# Patient Record
Sex: Female | Born: 1975
Health system: Southern US, Community
[De-identification: ages and names within clinical notes are randomized; demographics above are authoritative.]

## PROBLEM LIST (undated history)

## (undated) DIAGNOSIS — R7303 Prediabetes: Secondary | ICD-10-CM

## (undated) DIAGNOSIS — E1169 Type 2 diabetes mellitus with other specified complication: Secondary | ICD-10-CM

## (undated) DIAGNOSIS — Z6841 Body Mass Index (BMI) 40.0 and over, adult: Secondary | ICD-10-CM

## (undated) DIAGNOSIS — L989 Disorder of the skin and subcutaneous tissue, unspecified: Secondary | ICD-10-CM

## (undated) DIAGNOSIS — B977 Papillomavirus as the cause of diseases classified elsewhere: Secondary | ICD-10-CM

## (undated) DIAGNOSIS — E785 Hyperlipidemia, unspecified: Secondary | ICD-10-CM

## (undated) HISTORY — DX: Prediabetes: R73.03

## (undated) HISTORY — DX: Hyperlipidemia, unspecified: E11.69

## (undated) HISTORY — DX: Body Mass Index (BMI) 40.0 and over, adult: Z684

## (undated) HISTORY — PX: NO PAST SURGERIES: SHX2092

## (undated) HISTORY — DX: Morbid (severe) obesity due to excess calories: E66.01

## (undated) HISTORY — DX: Hyperlipidemia, unspecified: E78.5

---

## 2013-01-12 ENCOUNTER — Emergency Department (HOSPITAL_COMMUNITY)
Admission: EM | Admit: 2013-01-12 | Discharge: 2013-01-12 | Disposition: A | Discharge status: Home / Self Care | Payer: Managed Care, Other (non HMO) | Attending: Emergency Medicine | Admitting: Emergency Medicine

## 2013-01-12 ENCOUNTER — Encounter (HOSPITAL_COMMUNITY): Payer: Self-pay | Admitting: Emergency Medicine

## 2013-01-12 ENCOUNTER — Emergency Department (HOSPITAL_COMMUNITY): Payer: Managed Care, Other (non HMO)

## 2013-01-12 DIAGNOSIS — X500XXA Overexertion from strenuous movement or load, initial encounter: Secondary | ICD-10-CM | POA: Insufficient documentation

## 2013-01-12 DIAGNOSIS — Y929 Unspecified place or not applicable: Secondary | ICD-10-CM | POA: Insufficient documentation

## 2013-01-12 DIAGNOSIS — M25562 Pain in left knee: Secondary | ICD-10-CM

## 2013-01-12 DIAGNOSIS — W64XXXA Exposure to other animate mechanical forces, initial encounter: Secondary | ICD-10-CM | POA: Insufficient documentation

## 2013-01-12 DIAGNOSIS — Y939 Activity, unspecified: Secondary | ICD-10-CM | POA: Insufficient documentation

## 2013-01-12 DIAGNOSIS — S8990XA Unspecified injury of unspecified lower leg, initial encounter: Secondary | ICD-10-CM | POA: Insufficient documentation

## 2013-01-12 MED ORDER — NAPROXEN 250 MG PO TABS: 250.0000 mg | ORAL_TABLET | Freq: Two times a day (BID) | ORAL | Status: DC

## 2013-01-12 MED ORDER — HYDROCODONE-ACETAMINOPHEN 5-325 MG PO TABS: ORAL_TABLET | ORAL | Status: DC

## 2013-01-12 NOTE — ED Notes (Signed)
Pt states she was hit behind the left knee by a large dog and felt a pop, throbbing pain, constant. Tried OTC advil without relief

## 2013-01-12 NOTE — ED Notes (Signed)
Patient given discharge instruction, verbalized understand. Patient ambulatory on crutches out of the department.  

## 2013-01-12 NOTE — ED Provider Notes (Signed)
CSN: [id number]     Arrival date & time 01/12/13  0557 History   First MD Initiated Contact with Patient 01/12/13 [phone]     Chief Complaint  Patient presents with  . Knee Injury    HPI Pt was seen at 0625. Per pt, c/o gradual onset and persistence of constant left knee "pain" since yesterday. States the pain began after she was hit in the back of her left knee by a dog and "felt a pop." Describes the pain as "throbbing." Pt has been ambulatory since the incident. Pain worsens with weight bearing, improves with rest. Denies focal motor weakness, no tingling/numbness in extremity, no open wounds, no ecchymosis, no erythema, no fevers.    History reviewed. No pertinent past medical history.  History reviewed. No pertinent past surgical history.  History  Substance Use Topics  . Smoking status: Never Smoker   . Smokeless tobacco: Not on file  . Alcohol Use: Yes    Review of Systems ROS: Statement: All systems negative except as marked or noted in the HPI; Constitutional: Negative for fever and chills. ; ; Eyes: Negative for eye pain, redness and discharge. ; ; ENMT: Negative for ear pain, hoarseness, nasal congestion, sinus pressure and sore throat. ; ; Cardiovascular: Negative for chest pain, palpitations, diaphoresis, dyspnea and peripheral edema. ; ; Respiratory: Negative for cough, wheezing and stridor. ; ; Gastrointestinal: Negative for nausea, vomiting, diarrhea, abdominal pain, blood in stool, hematemesis, jaundice and rectal bleeding. . ; ; Genitourinary: Negative for dysuria, flank pain and hematuria. ; ; Musculoskeletal: Negative for back pain and neck pain. +left knee pain, swelling.; ; Skin: Negative for pruritus, rash, abrasions, blisters, bruising and skin lesion.; ; Neuro: Negative for headache, lightheadedness and neck stiffness. Negative for weakness, altered level of consciousness , altered mental status, extremity weakness, paresthesias, involuntary movement, seizure and syncope.      Allergies  Amoxapine and related and Amoxicillin  Home Medications   Current Outpatient Rx  Name  Route  Sig  Dispense  Refill  . HYDROcodone-acetaminophen (NORCO/VICODIN) 5-325 MG per tablet      1 or 2 tabs PO q6 hours prn pain   8 tablet   0   . naproxen (NAPROSYN) 250 MG tablet   Oral   Take 1 tablet (250 mg total) by mouth 2 (two) times daily with a meal.   14 tablet   0    BP 135/77  Pulse 101  Temp(Src) 98.6 F (37 C) (Oral)  Resp 20  Ht 5\' 5"  (1.651 m)  Wt 205 lb (92.987 kg)  BMI 34.11 kg/m2  SpO2 100%  LMP 01/01/2013 Physical Exam 0630: Physical examination:  Nursing notes reviewed; Vital signs and O2 SAT reviewed;  Constitutional: Well developed, Well nourished, Well hydrated, In no acute distress; Head:  Normocephalic, atraumatic; Eyes: EOMI, PERRL, No scleral icterus; ENMT: Mouth and pharynx normal, Mucous membranes moist; Neck: Supple, Full range of motion, No lymphadenopathy; Cardiovascular: Regular rate and rhythm, No murmur, rub, or gallop; Respiratory: Breath sounds clear & equal bilaterally, No rales, rhonchi, wheezes.  Speaking full sentences with ease, Normal respiratory effort/excursion; Chest: Nontender, Movement normal;; Extremities: Pulses normal, +decreased ROM F/E left knee due to increasing pain. Pt is able able to lift extended LLE off stretcher, and extend left lower leg against resistance.  No ligamentous laxity.  No patellar or quad tendon step-offs.  NMS intact left foot, strong pedal pp. +plantarflexion of left foot w/calf squeeze.  No palpable gap left Achilles's tendon.  No proximal fibular head tenderness.  No erythema, warmth, ecchymosis or deformity. +anterior left knee with generalized tenderness to palp, without specific area of point tenderness. NT left ankle/foot. No calf tenderness, edema or asymmetry.; Neuro: AA&Ox3, Major CN grossly intact.  Speech clear. No gross focal motor or sensory deficits in extremities.; Skin: Color normal,  Warm, Dry.   ED Course  Procedures    MDM  MDM Reviewed: nursing note and vitals Interpretation: x-ray   Dg Knee Complete 4 Views Left 01/12/2013   CLINICAL DATA:  Pain post blunt trauma  EXAM: LEFT KNEE - COMPLETE 4+ VIEW  COMPARISON:  None.  FINDINGS: There is no evidence of fracture, dislocation, or joint effusion. There is no evidence of arthropathy or other focal bone abnormality. Soft tissues are unremarkable.  IMPRESSION: Negative.   Electronically Signed   By: Oley Balm M.D.   On: 01/12/2013 06:28    0650:  No acute findings on XR. Will tx symptomatically with crutches/knee immobilizer, f/u Ortho MD. Dx and testing d/w pt and family.  Questions answered.  Verb understanding, agreeable to d/c home with outpt f/u.    Laray Anger, DO 01/13/13 1248

## 2013-01-16 ENCOUNTER — Encounter: Payer: Self-pay | Admitting: Orthopedic Surgery

## 2013-01-16 ENCOUNTER — Ambulatory Visit (INDEPENDENT_AMBULATORY_CARE_PROVIDER_SITE_OTHER): Payer: Managed Care, Other (non HMO) | Admitting: Orthopedic Surgery

## 2013-01-16 VITALS — BP 139/97 | Ht 62.0 in | Wt 220.0 lb

## 2013-01-16 DIAGNOSIS — S83412A Sprain of medial collateral ligament of left knee, initial encounter: Secondary | ICD-10-CM

## 2013-01-16 DIAGNOSIS — S83419A Sprain of medial collateral ligament of unspecified knee, initial encounter: Secondary | ICD-10-CM

## 2013-01-16 MED ORDER — HYDROCODONE-ACETAMINOPHEN 5-325 MG PO TABS: 1.0000 | ORAL_TABLET | Freq: Four times a day (QID) | ORAL | Status: DC | PRN

## 2013-01-16 MED ORDER — NAPROXEN 250 MG PO TABS: 250.0000 mg | ORAL_TABLET | Freq: Two times a day (BID) | ORAL | Status: DC

## 2013-01-16 NOTE — Patient Instructions (Addendum)
Pick up Naprosyn at pharmacy

## 2013-01-16 NOTE — Progress Notes (Signed)
Patient ID: Jessica Wang, female   DOB: 09-27-1975, 37 y.o.   MRN: [id number]  Chief Complaint  Patient presents with  . Knee Pain    Left knee pain d/t injury 01/17/13    HISTORY: 37 years old female who injured on Saturday, September 20. A dogear from the side her knee buckled she fell pop. Later that evening she did go to the emergency room because of persistent sharp dull throbbing stabbing 7/10 constant pain is better she doesn't walk on a prolonged time. She reports swelling and increased pain with walking. She was treated with naproxen twice a day and hydrocodone.  Review of systems negative except for seasonal allergies and certain food reactions. Allergy to amoxicillin. 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 BP 139/97  Ht 5\' 2"  (1.575 m)  Wt 220 lb (99.791 kg)  BMI 40.23 kg/m2  LMP 01/01/2013  Slightly obese and weight with a brace on her leg  Left knee tender over the medial joint line and medial epicondyle she has pain with valgus stress with a stable MCL her range of motion is limited actively to 90 passively 130 muscle tone is normal skin is intact good pulses noted normal sensation normal balance  X-rays negative  Sprain MCL versus medial meniscal tear  Recommend hinged knee brace continue Naprosyn and Vicodin return for reexamination and MRI if not improved  OK to return to work

## 2013-02-13 ENCOUNTER — Ambulatory Visit (INDEPENDENT_AMBULATORY_CARE_PROVIDER_SITE_OTHER): Payer: Managed Care, Other (non HMO) | Admitting: Orthopedic Surgery

## 2013-02-13 ENCOUNTER — Encounter: Payer: Self-pay | Admitting: Orthopedic Surgery

## 2013-02-13 VITALS — BP 114/77 | Ht 62.0 in | Wt 220.0 lb

## 2013-02-13 DIAGNOSIS — S83412D Sprain of medial collateral ligament of left knee, subsequent encounter: Secondary | ICD-10-CM

## 2013-02-13 DIAGNOSIS — Z5189 Encounter for other specified aftercare: Secondary | ICD-10-CM

## 2013-02-13 NOTE — Progress Notes (Signed)
Patient ID: Jessica Wang, female   DOB: [birth year], 37 y.o.   MRN: [id number]  Chief Complaint  Patient presents with  . Follow-up    4 week recheck left knee MCL sprain. DOI 01-17-13.   Status post MCL sprain left knee doing better mild pain currently in a hinged knee brace  No catching locking or giving way  The knee looks good she has no tenderness over the medial epicondyle no laxity on valgus stress mild discomfort knee stable in anteroposterior plane motor exam normal skin intact good pulse and normal sensation BP 114/77  Ht 5\' 2"  (1.575 m)  Wt 220 lb (99.791 kg)  BMI 40.23 kg/m2  MCL sprain  Brace x2 weeks follow up as needed

## 2013-02-13 NOTE — Patient Instructions (Signed)
Wear brace 2 more weeks

## 2013-03-05 ENCOUNTER — Encounter (HOSPITAL_COMMUNITY): Payer: Self-pay | Admitting: Emergency Medicine

## 2013-03-05 ENCOUNTER — Emergency Department (HOSPITAL_COMMUNITY)
Admission: EM | Admit: 2013-03-05 | Discharge: 2013-03-05 | Disposition: A | Discharge status: Home / Self Care | Payer: Managed Care, Other (non HMO) | Attending: Emergency Medicine | Admitting: Emergency Medicine

## 2013-03-05 DIAGNOSIS — IMO0002 Reserved for concepts with insufficient information to code with codable children: Secondary | ICD-10-CM | POA: Insufficient documentation

## 2013-03-05 DIAGNOSIS — L0201 Cutaneous abscess of face: Secondary | ICD-10-CM | POA: Insufficient documentation

## 2013-03-05 DIAGNOSIS — L03211 Cellulitis of face: Secondary | ICD-10-CM | POA: Insufficient documentation

## 2013-03-05 DIAGNOSIS — Z791 Long term (current) use of non-steroidal anti-inflammatories (NSAID): Secondary | ICD-10-CM | POA: Insufficient documentation

## 2013-03-05 MED ORDER — LIDOCAINE HCL (PF) 2 % IJ SOLN
2.0000 mL | Freq: Once | INTRAMUSCULAR | Status: AC
  Administered 2013-03-05: 2 mL
  Filled 2013-03-05: qty 10

## 2013-03-05 MED ORDER — SULFAMETHOXAZOLE-TRIMETHOPRIM 800-160 MG PO TABS
1.0000 | ORAL_TABLET | Freq: Two times a day (BID) | ORAL | Status: DC
Start: 2013-03-05 — End: 2013-05-19

## 2013-03-05 MED ORDER — HYDROCODONE-ACETAMINOPHEN 5-325 MG PO TABS: 1.0000 | ORAL_TABLET | ORAL | Status: DC | PRN

## 2013-03-05 NOTE — ED Notes (Signed)
Started out as pimple per pt to left lower area under jaw. States was bigger yesterday. Area is red, hard, warm to touch and hardness approx size of golf ball size. No trouble swallowing.

## 2013-03-09 NOTE — ED Provider Notes (Signed)
CSN: [id number]     Arrival date & time 03/05/13  1007 History   First MD Initiated Contact with Patient 03/05/13 1017     Chief Complaint  Patient presents with  . Abscess   (Consider location/radiation/quality/duration/timing/severity/associated sxs/prior Treatment) HPI Comments: Jessica Wang is a 37 y.o. Female presenting with an abscess of her left inferior mandible which started out as a pimple which she squeezed a small amount of pus from 2 days ago. It has since become larger and more intensely red and painful, although states it was larger yesterday but has been applying warm compresses, yet denies spontaneous drainage.  She denies previous abscess history.  There is no radiation of pain, she denies fever and has no difficulty swallowing.     The history is provided by the patient.    History reviewed. No pertinent past medical history. History reviewed. No pertinent past surgical history. Family History  Problem Relation Age of Onset  . Heart disease    . Cancer    . Arthritis     History  Substance Use Topics  . Smoking status: Never Smoker   . Smokeless tobacco: Not on file  . Alcohol Use: Yes     Comment: :"ight"   OB History   Grav Para Term Preterm Abortions TAB SAB Ect Mult Living                 Review of Systems  Constitutional: Negative for fever and chills.  HENT: Negative for facial swelling.   Respiratory: Negative for shortness of breath and wheezing.   Skin: Positive for color change and wound.  Neurological: Negative for numbness.    Allergies  Amoxapine and related and Amoxicillin  Home Medications   Current Outpatient Rx  Name  Route  Sig  Dispense  Refill  . diphenhydrAMINE (BENADRYL) 25 mg capsule   Oral   Take 25 mg by mouth at bedtime as needed for allergies or sleep.         Marland Kitchen triamcinolone (NASACORT ALLERGY 24HR) 55 MCG/ACT AERO nasal inhaler   Nasal   Place 2 sprays into the nose daily.         Marland Kitchen  HYDROcodone-acetaminophen (NORCO/VICODIN) 5-325 MG per tablet   Oral   Take 1 tablet by mouth every 6 (six) hours as needed for pain.   120 tablet   0   . HYDROcodone-acetaminophen (NORCO/VICODIN) 5-325 MG per tablet   Oral   Take 1 tablet by mouth every 4 (four) hours as needed for moderate pain.   15 tablet   0   . naproxen (NAPROSYN) 250 MG tablet   Oral   Take 1 tablet (250 mg total) by mouth 2 (two) times daily with a meal.   120 tablet   0   . sulfamethoxazole-trimethoprim (SEPTRA DS) 800-160 MG per tablet   Oral   Take 1 tablet by mouth 2 (two) times daily.   28 tablet   0    BP 135/91  Pulse 107  Temp(Src) 98.3 F (36.8 C) (Oral)  Resp 18  SpO2 100%  LMP 02/27/2013 Physical Exam  Constitutional: She appears well-developed and well-nourished. No distress.  HENT:  Head: Normocephalic.  Neck: Neck supple.    Abscess left mandible which is indurated with centrally raised and fluctuant center, 4 cm.   Cardiovascular: Normal rate.   Pulmonary/Chest: Effort normal. She has no wheezes.  Musculoskeletal: Normal range of motion. She exhibits no edema.  Lymphadenopathy:  Head (left side): Submandibular adenopathy present.    ED Course  Procedures (including critical care time)  INCISION AND DRAINAGE Performed by: Burgess Amor Consent: Verbal consent obtained. Risks and benefits: risks, benefits and alternatives were discussed Type: abscess  Body area: left mandible  Anesthesia: local infiltration  Incision was made with a scalpel.  Local anesthetic: lidocaine 2% without epinephrine  Anesthetic total: 2 ml  Complexity: complex Blunt dissection to break up loculations  Drainage: purulent  Drainage amount: moderate  Packing material: no packing  Patient tolerance: Patient tolerated the procedure well with no immediate complications.    Labs Review Labs Reviewed - No data to display Imaging Review No results found.  EKG Interpretation     None       MDM   1. Facial abscess    Pt was seen by Dr. Estell Harpin prior to I & D.  She was prescribed bactrim,  Hydrocodone.  Encouraged warm compresses and recheck by Dr. Suszanne Conners in his [place] clinic in 1 day.  Pt to call for appt.    Burgess Amor, PA-C 03/09/13 [phone]

## 2013-03-11 NOTE — ED Provider Notes (Signed)
Medical screening examination/treatment/procedure(s) were performed by non-physician practitioner and as supervising physician I was immediately available for consultation/collaboration.  EKG Interpretation   None         Chimere Klingensmith L Arvind Mexicano, MD 03/11/13 1107 

## 2013-04-24 DIAGNOSIS — L989 Disorder of the skin and subcutaneous tissue, unspecified: Secondary | ICD-10-CM

## 2013-04-24 HISTORY — DX: Disorder of the skin and subcutaneous tissue, unspecified: L98.9

## 2013-05-19 ENCOUNTER — Encounter (HOSPITAL_BASED_OUTPATIENT_CLINIC_OR_DEPARTMENT_OTHER): Payer: Self-pay | Admitting: *Deleted

## 2013-05-23 NOTE — H&P (Signed)
Assessment  Cellulitis (682.9) (L03.90). Discussed  It has not gone down any further but it has stayed the same size. On exam, there is what appears to be a cystic lesion in the dermal layer of the skin. Recommend excision of this under local anesthesia in the outpatient setting. Reason For Visit  Cellulitis. Allergies  Amoxicillin TABS. Active Problems  Cellulitis   (682.9) (L03.90). Family Hx  Family history of malignant neoplasm of breast: Maternal Grandmother (V16.3) (Z80.3) Ovarian ca: Maternal Grandmother (C56.9) Seasonal allergies: Mother,Father (J30.2). Personal Hx  Alcohol use; social 2 drinks/day or fewer Caffeine use (V49.89) (F15.929); 2 cups daily Never smoker (V49.89) (Z78.9). Signature  Electronically signed by : Serena ColonelJefry  Lulla Linville  M.D.; 04/29/2013 2:02 PM EST.

## 2013-05-26 ENCOUNTER — Encounter (HOSPITAL_BASED_OUTPATIENT_CLINIC_OR_DEPARTMENT_OTHER): Payer: Self-pay | Admitting: *Deleted

## 2013-05-26 ENCOUNTER — Ambulatory Visit (HOSPITAL_BASED_OUTPATIENT_CLINIC_OR_DEPARTMENT_OTHER)
Admission: RE | Admit: 2013-05-26 | Discharge: 2013-05-26 | Disposition: A | Discharge status: Home / Self Care | Payer: Managed Care, Other (non HMO) | Source: Ambulatory Visit | Attending: Otolaryngology | Admitting: Otolaryngology

## 2013-05-26 ENCOUNTER — Encounter (HOSPITAL_BASED_OUTPATIENT_CLINIC_OR_DEPARTMENT_OTHER)
Admission: RE | Disposition: A | Discharge status: Home / Self Care | Payer: Self-pay | Source: Ambulatory Visit | Attending: Otolaryngology

## 2013-05-26 DIAGNOSIS — L989 Disorder of the skin and subcutaneous tissue, unspecified: Secondary | ICD-10-CM

## 2013-05-26 HISTORY — DX: Disorder of the skin and subcutaneous tissue, unspecified: L98.9

## 2013-05-26 HISTORY — PX: LESION REMOVAL: SHX5196

## 2013-05-26 SURGERY — MINOR EXCISION OF LESION
Anesthesia: LOCAL | Site: Neck | Laterality: Left

## 2013-05-26 MED ORDER — LACTATED RINGERS IV SOLN: INTRAVENOUS | Status: DC

## 2013-05-26 MED ORDER — OXYMETAZOLINE HCL 0.05 % NA SOLN
NASAL | Status: AC
  Filled 2013-05-26: qty 15

## 2013-05-26 MED ORDER — MIDAZOLAM HCL 2 MG/2ML IJ SOLN: 1.0000 mg | INTRAMUSCULAR | Status: DC | PRN

## 2013-05-26 MED ORDER — MIDAZOLAM HCL 2 MG/ML PO SYRP: 12.0000 mg | ORAL_SOLUTION | Freq: Once | ORAL | Status: DC | PRN

## 2013-05-26 MED ORDER — LIDOCAINE-EPINEPHRINE 1 %-1:100000 IJ SOLN
INTRAMUSCULAR | Status: AC
  Filled 2013-05-26: qty 1

## 2013-05-26 MED ORDER — LIDOCAINE-EPINEPHRINE 1 %-1:100000 IJ SOLN
INTRAMUSCULAR | Status: DC | PRN
  Administered 2013-05-26: 2 mL

## 2013-05-26 MED ORDER — FENTANYL CITRATE 0.05 MG/ML IJ SOLN: 50.0000 ug | INTRAMUSCULAR | Status: DC | PRN

## 2013-05-26 SURGICAL SUPPLY — 51 items
ATTRACTOMAT 16X20 MAGNETIC DRP (DRAPES) IMPLANT
BENZOIN TINCTURE PRP APPL 2/3 (GAUZE/BANDAGES/DRESSINGS) IMPLANT
BLADE SURG 15 STRL LF DISP TIS (BLADE) ×2 IMPLANT
BLADE SURG 15 STRL SS (BLADE) ×1
CANISTER SUCT 1200ML W/VALVE (MISCELLANEOUS) IMPLANT
CLEANER CAUTERY TIP 5X5 PAD (MISCELLANEOUS) ×2 IMPLANT
CLIP TI MEDIUM 6 (CLIP) IMPLANT
CLIP TI WIDE RED SMALL 6 (CLIP) IMPLANT
CORDS BIPOLAR (ELECTRODE) IMPLANT
COVER MAYO STAND STRL (DRAPES) ×3 IMPLANT
COVER TABLE BACK 60X90 (DRAPES) ×3 IMPLANT
DERMABOND ADVANCED (GAUZE/BANDAGES/DRESSINGS)
DERMABOND ADVANCED .7 DNX12 (GAUZE/BANDAGES/DRESSINGS) IMPLANT
DRAIN JACKSON RD 7FR 3/32 (WOUND CARE) IMPLANT
DRAIN PENROSE 1/4X12 LTX STRL (WOUND CARE) IMPLANT
DRAPE U-SHAPE 76X120 STRL (DRAPES) ×3 IMPLANT
ELECT COATED BLADE 2.86 ST (ELECTRODE) ×3 IMPLANT
ELECT REM PT RETURN 9FT ADLT (ELECTROSURGICAL) ×3
ELECTRODE REM PT RTRN 9FT ADLT (ELECTROSURGICAL) ×2 IMPLANT
EVACUATOR SILICONE 100CC (DRAIN) IMPLANT
GAUZE SPONGE 4X4 16PLY XRAY LF (GAUZE/BANDAGES/DRESSINGS) IMPLANT
GLOVE BIO SURGEON STRL SZ 6.5 (GLOVE) ×3 IMPLANT
GLOVE ECLIPSE 7.5 STRL STRAW (GLOVE) ×3 IMPLANT
GOWN STRL REUS W/ TWL LRG LVL3 (GOWN DISPOSABLE) ×4 IMPLANT
GOWN STRL REUS W/TWL LRG LVL3 (GOWN DISPOSABLE) ×2
NEEDLE 27GAX1X1/2 (NEEDLE) ×3 IMPLANT
NEEDLE HYPO 25X1 1.5 SAFETY (NEEDLE) ×3 IMPLANT
NS IRRIG 1000ML POUR BTL (IV SOLUTION) IMPLANT
PACK BASIN DAY SURGERY FS (CUSTOM PROCEDURE TRAY) ×3 IMPLANT
PAD CLEANER CAUTERY TIP 5X5 (MISCELLANEOUS) ×1
PENCIL FOOT CONTROL (ELECTRODE) ×3 IMPLANT
RUBBERBAND STERILE (MISCELLANEOUS) IMPLANT
SHEET MEDIUM DRAPE 40X70 STRL (DRAPES) IMPLANT
SPONGE GAUZE 2X2 8PLY STRL LF (GAUZE/BANDAGES/DRESSINGS) IMPLANT
SPONGE GAUZE 4X4 12PLY STER LF (GAUZE/BANDAGES/DRESSINGS) IMPLANT
STAPLER VISISTAT 35W (STAPLE) IMPLANT
STRIP CLOSURE SKIN 1/2X4 (GAUZE/BANDAGES/DRESSINGS) IMPLANT
SUCTION FRAZIER TIP 10 FR DISP (SUCTIONS) IMPLANT
SUT CHROMIC 3 0 PS 2 (SUTURE) IMPLANT
SUT CHROMIC 4 0 P 3 18 (SUTURE) IMPLANT
SUT ETHILON 4 0 PS 2 18 (SUTURE) ×3 IMPLANT
SUT ETHILON 5 0 P 3 18 (SUTURE)
SUT NYLON ETHILON 5-0 P-3 1X18 (SUTURE) IMPLANT
SUT PLAIN 5 0 P 3 18 (SUTURE) IMPLANT
SUT SILK 4 0 TIES 17X18 (SUTURE) IMPLANT
SUT SURG 6 0 PRE1/P 10 (SUTURE) IMPLANT
SUT VICRYL 4-0 PS2 18IN ABS (SUTURE) IMPLANT
SYR BULB 3OZ (MISCELLANEOUS) IMPLANT
SYR CONTROL 10ML LL (SYRINGE) ×3 IMPLANT
TRAY DSU PREP LF (CUSTOM PROCEDURE TRAY) ×3 IMPLANT
TUBE CONNECTING 20X1/4 (TUBING) ×3 IMPLANT

## 2013-05-26 NOTE — Interval H&P Note (Signed)
History and Physical Interval Note:  05/26/2013 7:23 AM  Kendell BaneGermaine Thad RangerLongenbach  has presented today for surgery, with the diagnosis of left neck skin lesion  The various methods of treatment have been discussed with the patient and family. After consideration of risks, benefits and other options for treatment, the patient has consented to  Procedure(s): EXCISION LEFT NECK SKIN LESION (Left) as a surgical intervention .  The patient's history has been reviewed, patient examined, no change in status, stable for surgery.  I have reviewed the patient's chart and labs.  Questions were answered to the patient's satisfaction.     Kamarius Buckbee

## 2013-05-26 NOTE — Op Note (Signed)
OPERATIVE REPORT  DATE OF SURGERY: 05/26/2013  PATIENT:  Jessica Wang,  38 y.o. female  PRE-OPERATIVE DIAGNOSIS:  left neck skin lesion  POST-OPERATIVE DIAGNOSIS:  left neck skin lesion  PROCEDURE:  Procedure(s): EXCISION LEFT NECK SKIN LESION  SURGEON:  Susy FrizzleJefry H Jamaia Brum, MD  ASSISTANTS: None  ANESTHESIA:   Local   EBL:  5 ml  DRAINS: None   LOCAL MEDICATIONS USED:  1% Xylocaine with epinephrine  SPECIMEN:  Left anterior neck skin lesion  COUNTS:  Correct  PROCEDURE DETAILS: The patient was taken to the operating room and placed on the operating table in the supine position. The neck was prepped and draped in a standard fashion. 1% Xylocaine with epinephrine was infiltrated around the lesion. An ellipse of skin was outlined with a marking pen. A #15 scalpel was used to incise the skin and to remove the entire lesion. Electrocautery was used on a low setting for hemostasis. The defect was reapproximated with a running 4-0 nylon suture. Bacitracin and a dressing was applied. She tolerated this well.    PATIENT DISPOSITION:  To PACU, stable

## 2013-05-26 NOTE — Discharge Instructions (Signed)
Apply bacitracin ointment 3 times daily with a clean dressing. Keep it clean and dry.

## 2013-05-28 ENCOUNTER — Encounter (HOSPITAL_BASED_OUTPATIENT_CLINIC_OR_DEPARTMENT_OTHER): Payer: Self-pay | Admitting: Otolaryngology

## 2014-06-27 IMAGING — CR DG KNEE COMPLETE 4+V*L*
4 series · 4 of 4 positions shown · non-contrast
Comparison: None.

CLINICAL DATA: Pain post blunt trauma

EXAM:
LEFT KNEE - COMPLETE 4+ VIEW

[view not recorded (1 of 4)]
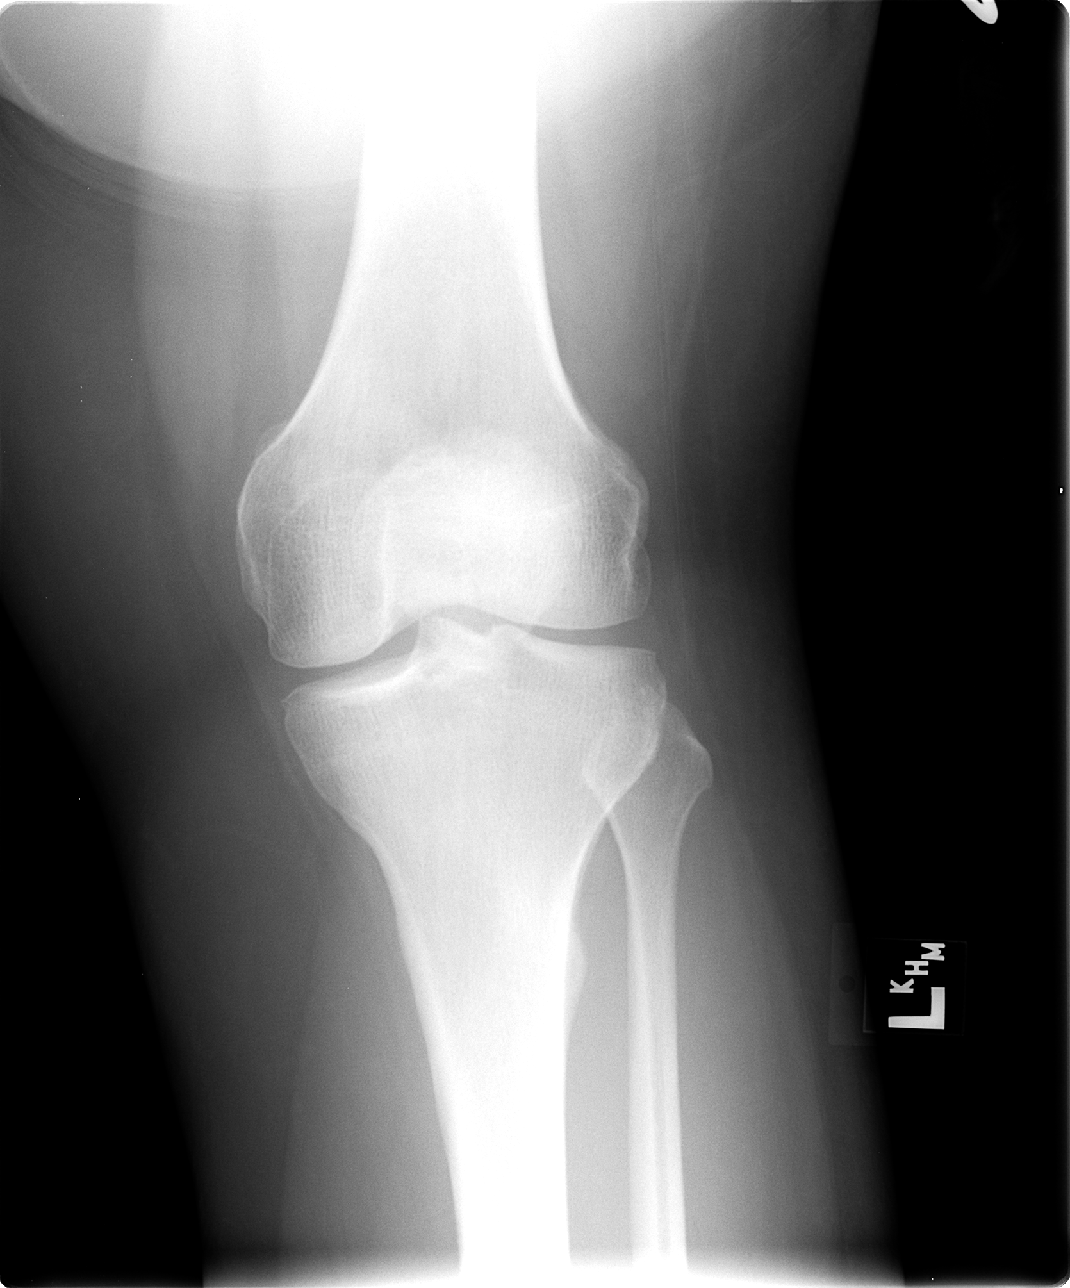

[view not recorded (2 of 4)]
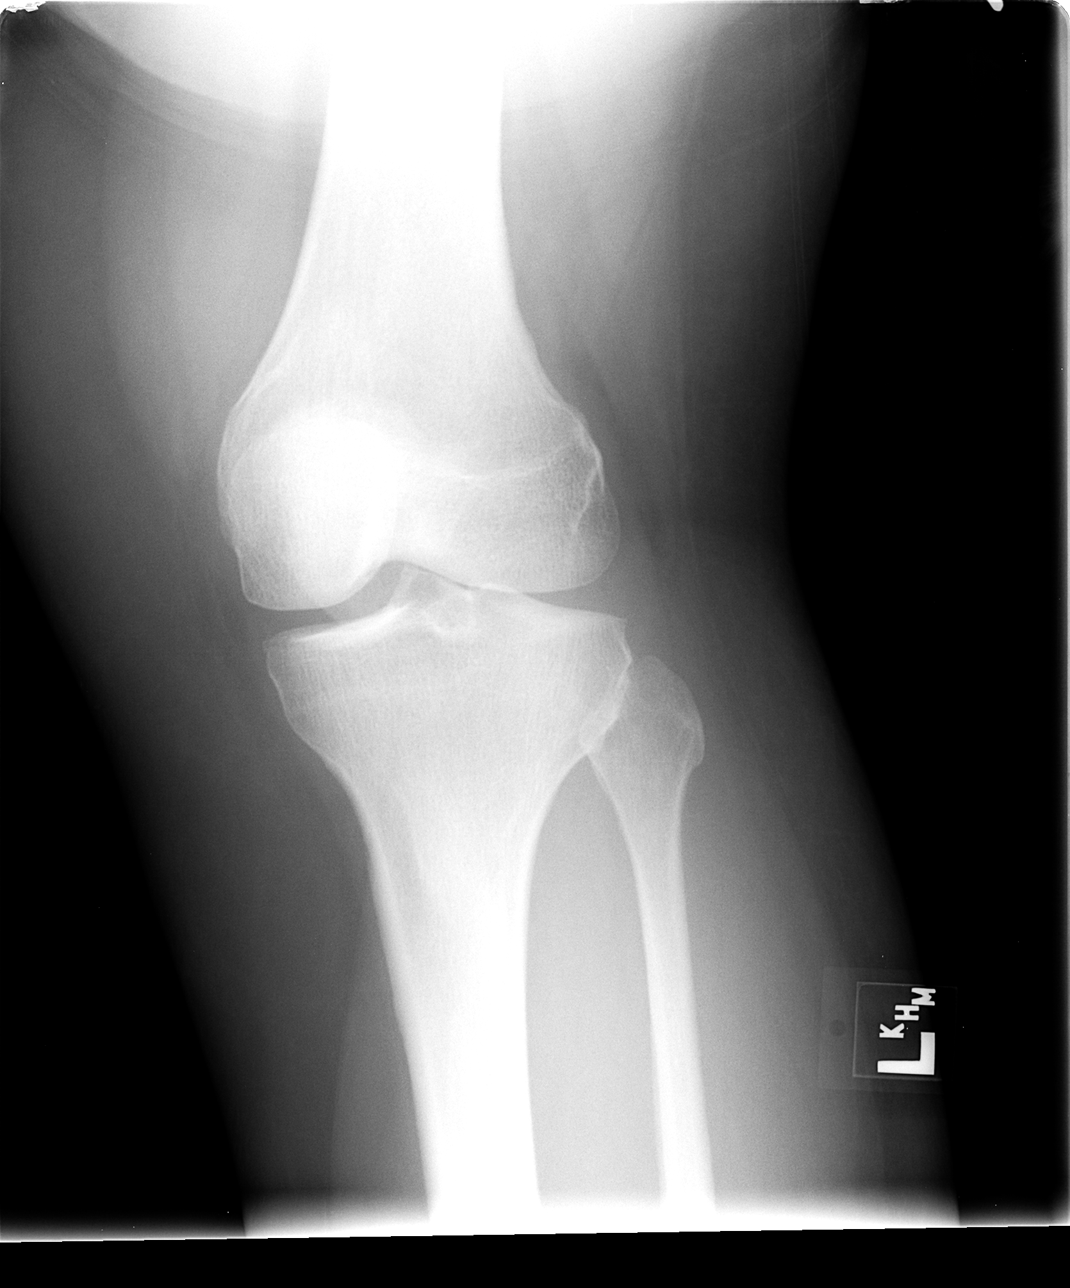

[view not recorded (3 of 4)]
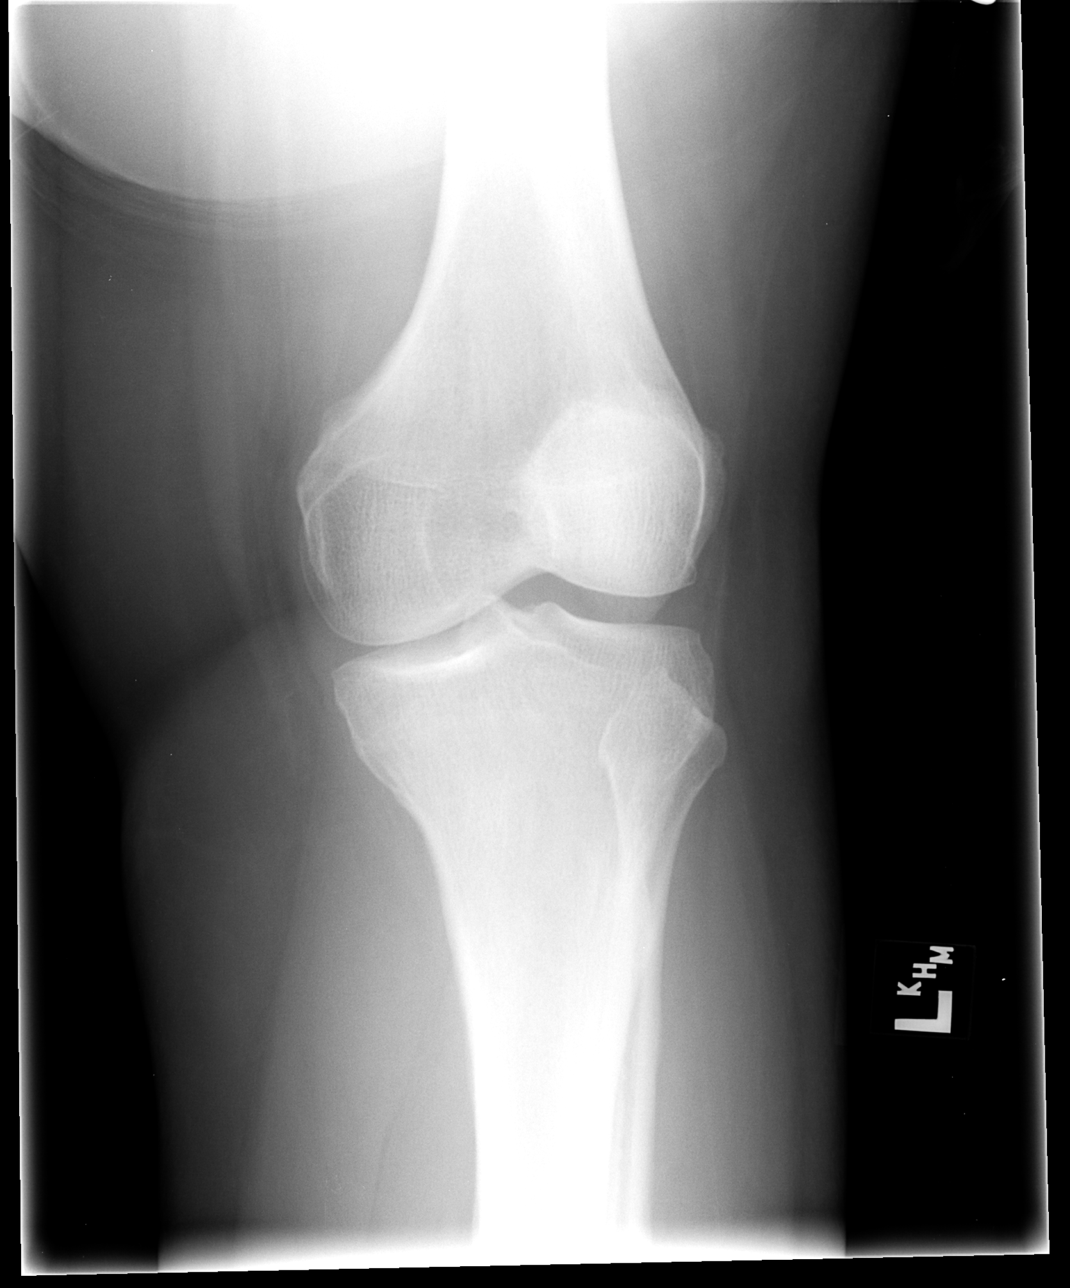

[view not recorded (4 of 4)]
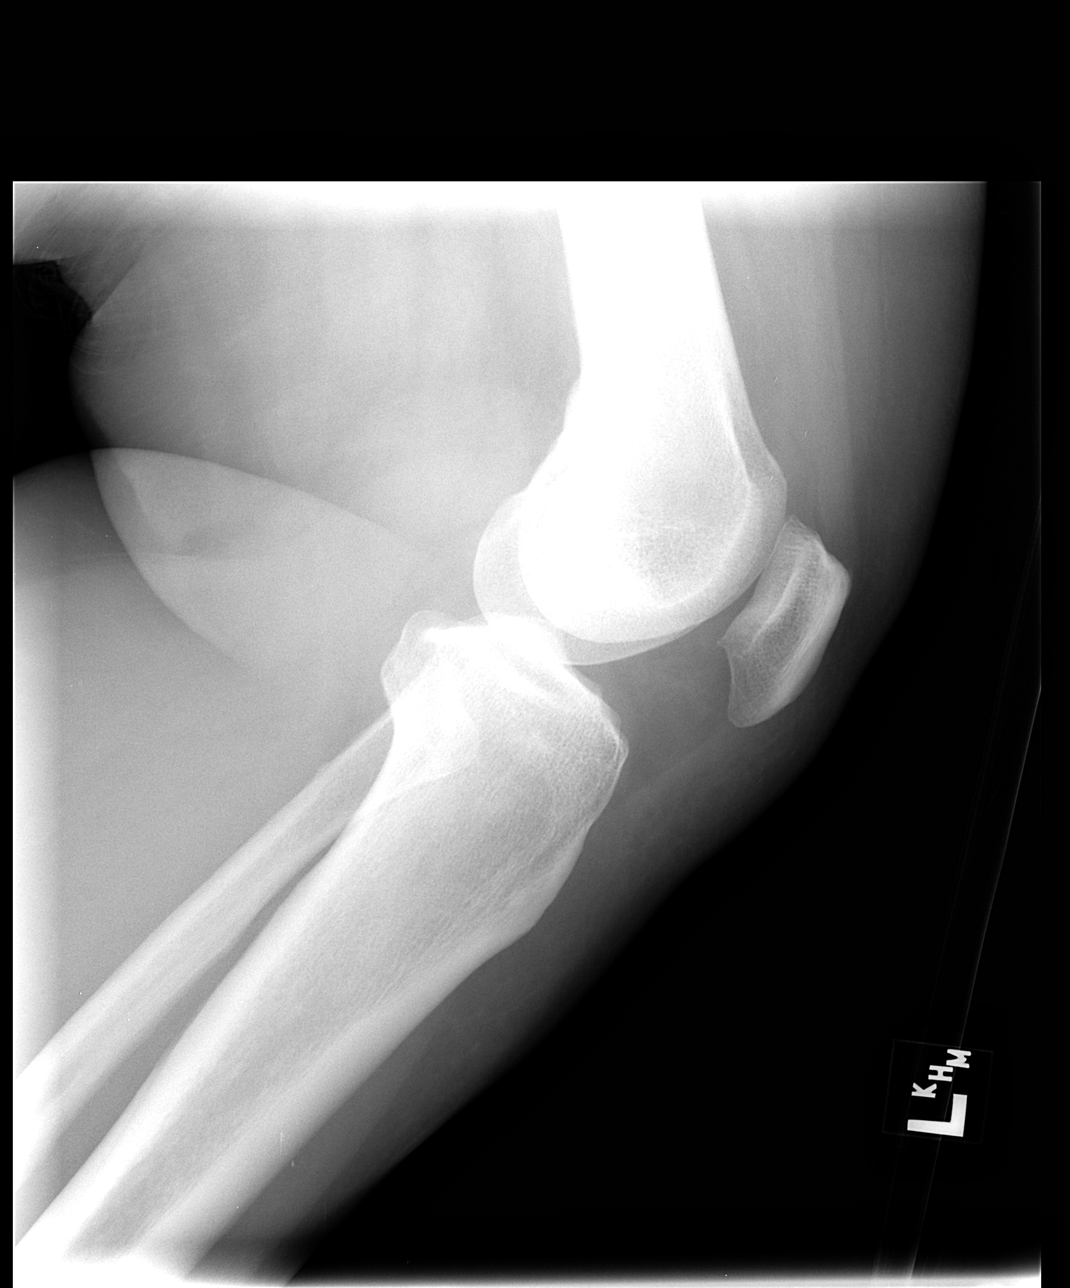

[4 of 4 positions shown; findings below may reference images not displayed]

FINDINGS: There is no evidence of fracture, dislocation, or joint effusion.
There is no evidence of arthropathy or other focal bone abnormality.
Soft tissues are unremarkable.
IMPRESSION: Negative.

## 2017-02-09 ENCOUNTER — Ambulatory Visit (HOSPITAL_COMMUNITY)
Admission: EM | Admit: 2017-02-09 | Discharge: 2017-02-09 | Disposition: A | Discharge status: Home / Self Care | Payer: 59 | Attending: Emergency Medicine | Admitting: Emergency Medicine

## 2017-02-09 ENCOUNTER — Encounter (HOSPITAL_COMMUNITY): Payer: Self-pay | Admitting: Emergency Medicine

## 2017-02-09 DIAGNOSIS — E119 Type 2 diabetes mellitus without complications: Secondary | ICD-10-CM | POA: Insufficient documentation

## 2017-02-09 DIAGNOSIS — R300 Dysuria: Secondary | ICD-10-CM

## 2017-02-09 DIAGNOSIS — R600 Localized edema: Secondary | ICD-10-CM | POA: Diagnosis present

## 2017-02-09 DIAGNOSIS — R109 Unspecified abdominal pain: Secondary | ICD-10-CM | POA: Diagnosis not present

## 2017-02-09 DIAGNOSIS — N898 Other specified noninflammatory disorders of vagina: Secondary | ICD-10-CM | POA: Insufficient documentation

## 2017-02-09 DIAGNOSIS — B373 Candidiasis of vulva and vagina: Secondary | ICD-10-CM | POA: Insufficient documentation

## 2017-02-09 DIAGNOSIS — N3090 Cystitis, unspecified without hematuria: Secondary | ICD-10-CM

## 2017-02-09 DIAGNOSIS — B3731 Acute candidiasis of vulva and vagina: Secondary | ICD-10-CM

## 2017-02-09 LAB — POCT I-STAT, CHEM 8
BUN: 7 mg/dL (ref 6–20)
CREATININE: 0.6 mg/dL (ref 0.44–1.00)
Calcium, Ion: 1.07 mmol/L — ABNORMAL LOW (ref 1.15–1.40)
Chloride: 102 mmol/L (ref 101–111)
GLUCOSE: 243 mg/dL — AB (ref 65–99)
HCT: 40 % (ref 36.0–46.0)
HEMOGLOBIN: 13.6 g/dL (ref 12.0–15.0)
Potassium: 4 mmol/L (ref 3.5–5.1)
Sodium: 138 mmol/L (ref 135–145)
TCO2: 27 mmol/L (ref 22–32)

## 2017-02-09 LAB — POCT URINALYSIS DIP (DEVICE)
Bilirubin Urine: NEGATIVE
Glucose, UA: >=1000 mg/dL — AB
HGB URINE DIPSTICK: NEGATIVE
Leukocytes, UA: NEGATIVE
Nitrite: NEGATIVE
PROTEIN: NEGATIVE mg/dL
SPECIFIC GRAVITY, URINE: 1.025 (ref 1.005–1.030)
UROBILINOGEN UA: 0.2 mg/dL (ref 0.0–1.0)
pH: 6 (ref 5.0–8.0)

## 2017-02-09 MED ORDER — METFORMIN HCL ER 500 MG PO TB24
500.0000 mg | ORAL_TABLET | Freq: Every day | ORAL | 0 refills | Status: DC
Start: 2017-02-09 — End: 2017-08-06

## 2017-02-09 MED ORDER — PHENAZOPYRIDINE HCL 200 MG PO TABS: 200.0000 mg | ORAL_TABLET | Freq: Three times a day (TID) | ORAL | 0 refills | Status: DC

## 2017-02-09 MED ORDER — FLUCONAZOLE 200 MG PO TABS: 200.0000 mg | ORAL_TABLET | Freq: Every day | ORAL | 0 refills | Status: AC

## 2017-02-09 NOTE — ED Triage Notes (Signed)
Pt c/o burning with urination for a few days. Also states her vaginal lips are swollen.

## 2017-02-09 NOTE — Discharge Instructions (Signed)
Diabetes Mellitus and Standards of Medical Care Managing diabetes (diabetes mellitus) can be complicated. Your diabetes treatment may be managed by a team of health care providers, including:  A diet and nutrition specialist (registered dietitian).  A nurse.  A certified diabetes educator (CDE).  A diabetes specialist (endocrinologist).  An eye doctor.  A primary care provider.  A dentist.  Your health care providers follow a schedule in order to help you get the best quality of care. The following schedule is a general guideline for your diabetes management plan. Your health care providers may also give you more specific instructions. HbA1c ( hemoglobin A1c) test This test provides information about blood sugar (glucose) control over the previous 2-3 months. It is used to check whether your diabetes management plan needs to be adjusted.  If you are meeting your treatment goals, this test is done at least 2 times a year.  If you are not meeting treatment goals or if your treatment goals have changed, this test is done 4 times a year.  Blood pressure test  This test is done at every routine medical visit. For most people, the goal is less than 130/80. Ask your health care provider what your goal blood pressure should be. Dental and eye exams  Visit your dentist two times a year.  If you have type 1 diabetes, get an eye exam 3-5 years after you are diagnosed, and then once a year after your first exam. ? If you were diagnosed with type 1 diabetes as a child, get an eye exam when you are age 12 or older and have had diabetes for 3-5 years. After the first exam, you should get an eye exam once a year.  If you have type 2 diabetes, have an eye exam as soon as you are diagnosed, and then once a year after your first exam. Foot care exam  Visual foot exams are done at every routine medical visit. The exams check for cuts, bruises, redness, blisters, sores, or other problems with  the feet.  A complete foot exam is done by your health care provider once a year. This exam includes an inspection of the structure and skin of your feet, and a check of the pulses and sensation in your feet. ? Type 1 diabetes: Get your first exam 3-5 years after diagnosis. ? Type 2 diabetes: Get your first exam as soon as you are diagnosed.  Check your feet every day for cuts, bruises, redness, blisters, or sores. If you have any of these or other problems that are not healing, contact your health care provider. Kidney function test ( urine microalbumin)  This test is done once a year. ? Type 1 diabetes: Get your first test 5 years after diagnosis. ? Type 2 diabetes: Get your first test as soon as you are diagnosed.  If you have chronic kidney disease (CKD), get a serum creatinine and estimated glomerular filtration rate (eGFR) test once a year. Lipid profile (cholesterol, HDL, LDL, triglycerides)  This test should be done when you are diagnosed with diabetes, and every 5 years after the first test. If you are on medicines to lower your cholesterol, you may need to get this test done every year. ? The goal for LDL is less than 100 mg/dL (5.5 mmol/L). If you are at high risk, the goal is less than 70 mg/dL (3.9 mmol/L). ? The goal for HDL is 40 mg/dL (2.2 mmol/L) for men and 50 mg/dL(2.8 mmol/L) for women. An  HDL cholesterol of 60 mg/dL (3.3 mmol/L) or higher gives some protection against heart disease. ? The goal for triglycerides is less than 150 mg/dL (8.3 mmol/L). Immunizations  The yearly flu (influenza) vaccine is recommended for everyone 6 months or older who has diabetes.  The pneumonia (pneumococcal) vaccine is recommended for everyone 2 years or older who has diabetes. If you are 43 or older, you may get the pneumonia vaccine as a series of two separate shots.  The hepatitis B vaccine is recommended for adults shortly after they have been diagnosed with diabetes.  The Tdap  (tetanus, diphtheria, and pertussis) vaccine should be given: ? According to normal childhood vaccination schedules, for children. ? Every 10 years, for adults who have diabetes.  The shingles vaccine is recommended for people who have had chicken pox and are 50 years or older. Mental and emotional health  Screening for symptoms of eating disorders, anxiety, and depression is recommended at the time of diagnosis and afterward as needed. If your screening shows that you have symptoms (you have a positive screening result), you may need further evaluation and be referred to a mental health care provider. Diabetes self-management education  Education about how to manage your diabetes is recommended at diagnosis and ongoing as needed. Treatment plan  Your treatment plan will be reviewed at every medical visit. Summary  Managing diabetes (diabetes mellitus) can be complicated. Your diabetes treatment may be managed by a team of health care providers.  Your health care providers follow a schedule in order to help you get the best quality of care.  Standards of care including having regular physical exams, blood tests, blood pressure monitoring, immunizations, screening tests, and education about how to manage your diabetes.  Your health care providers may also give you more specific instructions based on your individual health. This information is not intended to replace advice given to you by your health care provider. Make sure you discuss any questions you have with your health care provider. Document Released: 02/05/2009 Document Revised: 01/07/2016 Document Reviewed: 01/07/2016 Elsevier Interactive Patient Education  [employer]. With you lab testing you sugar levels are elevated and we will treat you with new onset of diabetes We will start you on metformin for this. You will need to take this every morning then eat after taking. This medication can cause stomach irritation and  increase bowel movements I have listed a primary care doctor that you can call to set up an appoint for follow care.  We will send off your urine and if this returns with an infection we will call you with an antibiotic.

## 2017-02-09 NOTE — ED Provider Notes (Signed)
[facility]    CSN: [id number] Arrival date & time: 02/09/17  1525     History   Chief Complaint Chief Complaint  Patient presents with  . Dysuria  . Groin Swelling    HPI Jessica Wang is a 40 y.o. female.   Pt is here for dysuria , external vaginal irritation, and frequency for 3 days. RT flank pain intermit . States that she has taken otc urinary things, drinking cranberry juice, and drinking water with no relief.       Past Medical History:  Diagnosis Date  . Skin lesion 04/2013   left neck; is open and draining    Patient Active Problem List   Diagnosis Date Noted  . Knee MCL sprain 01/16/2013    Past Surgical History:  Procedure Laterality Date  . LESION REMOVAL Left 05/26/2013   Procedure: MINOR EXICISION OF Neck Skin LESION;  Surgeon: Serena Colonel, MD;  Location:  SURGERY CENTER;  Service: ENT;  Laterality: Left;  . NO PAST SURGERIES      OB History    No data available       Home Medications    Prior to Admission medications   Medication Sig Start Date End Date Taking? Authorizing Provider  fluconazole (DIFLUCAN) 200 MG tablet Take 1 tablet (200 mg total) by mouth daily. 02/09/17 02/16/17  Coralyn Mark, NP  metFORMIN (GLUCOPHAGE XR) 500 MG 24 hr tablet Take 1 tablet (500 mg total) by mouth daily with breakfast. 02/09/17   Coralyn Mark, NP    Family History Family History  Problem Relation Age of Onset  . Heart disease Unknown   . Cancer Unknown   . Arthritis Unknown     Social History Social History  Substance Use Topics  . Smoking status: Never Smoker  . Smokeless tobacco: Never Used  . Alcohol use Yes     Comment: occasionally     Allergies   Amoxicillin   Review of Systems Review of Systems  Constitutional: Negative.   Respiratory: Negative.   Cardiovascular: Negative.   Gastrointestinal: Positive for nausea.       RT side  Flank pain intermit   Endocrine: Positive for polydipsia  and polyuria.  Genitourinary:       Vaginal labia minora erythema, edema, pain on urination,   Musculoskeletal: Negative.   Skin: Negative.   Neurological: Negative.      Physical Exam Triage Vital Signs ED Triage Vitals [02/09/17 1556]  Enc Vitals Group     BP (!) 141/91     Pulse Rate (!) 115     Resp 18     Temp 98 F (36.7 C)     Temp src      SpO2 100 %     Weight      Height      Head Circumference      Peak Flow      Pain Score 8     Pain Loc      Pain Edu?      Excl. in GC?    No data found.   Updated Vital Signs BP (!) 141/91   Pulse (!) 115   Temp 98 F (36.7 C)   Resp 18   LMP 01/26/2017   SpO2 100%   Visual Acuity Right Eye Distance:   Left Eye Distance:   Bilateral Distance:    Right Eye Near:   Left Eye Near:    Bilateral Near:  Physical Exam  Constitutional: She appears well-developed.  Cardiovascular: Normal rate and regular rhythm.   Pulmonary/Chest: Effort normal and breath sounds normal.  Abdominal: Soft. Bowel sounds are normal. There is tenderness.  Genitourinary:  Genitourinary Comments: Labia minora erythema, edema,   Neurological: She is alert.  Skin: Rash noted. There is erythema.  Vaginal area      UC Treatments / Results  Labs (all labs ordered are listed, but only abnormal results are displayed) Labs Reviewed  POCT URINALYSIS DIP (DEVICE) - Abnormal; Notable for the following:       Result Value   Glucose, UA >=1000 (*)    Ketones, ur TRACE (*)    All other components within normal limits  POCT I-STAT, CHEM 8 - Abnormal; Notable for the following:    Glucose, Bld 243 (*)    Calcium, Ion 1.07 (*)    All other components within normal limits  URINE CULTURE    EKG  EKG Interpretation None       Radiology No results found.  Procedures Procedures (including critical care time)  Medications Ordered in UC Medications - No data to display   Initial Impression / Assessment and Plan / UC Course  I  have reviewed the triage vital signs and the nursing notes.  Pertinent labs & imaging results that were available during my care of the patient were reviewed by me and considered in my medical decision making (see chart for details).     With you lab testing you sugar levels are elevated and we will treat you with new onset of diabetes We will start you on metformin for this. You will need to take this every morning then eat after taking. This medication can cause stomach irritation and increase bowel movements I have listed a primary care doctor that you can call to set up an appoint for follow care.  We will send off your urine and if this returns with an infection we will call you with an antibiotic.  Final Clinical Impressions(s) / UC Diagnoses   Final diagnoses:  Cystitis  Dysuria  Yeast infection of the vagina  New onset type 2 diabetes mellitus (HCC)    New Prescriptions Discharge Medication List as of 02/09/2017  5:34 PM    START taking these medications   Details  fluconazole (DIFLUCAN) 200 MG tablet Take 1 tablet (200 mg total) by mouth daily., Starting Fri 02/09/2017, Until Fri 02/16/2017, Normal    metFORMIN (GLUCOPHAGE XR) 500 MG 24 hr tablet Take 1 tablet (500 mg total) by mouth daily with breakfast., Starting Fri 02/09/2017, Normal         Controlled Substance Prescriptions [place] Controlled Substance Registry consulted? Not Applicable   Coralyn MarkMitchell, Kamran Coker L, NP 02/09/17 1750

## 2017-02-11 LAB — URINE CULTURE

## 2017-06-12 DIAGNOSIS — E119 Type 2 diabetes mellitus without complications: Secondary | ICD-10-CM | POA: Diagnosis not present

## 2017-06-12 DIAGNOSIS — R030 Elevated blood-pressure reading, without diagnosis of hypertension: Secondary | ICD-10-CM | POA: Diagnosis not present

## 2017-06-12 DIAGNOSIS — E782 Mixed hyperlipidemia: Secondary | ICD-10-CM | POA: Diagnosis not present

## 2017-06-12 DIAGNOSIS — R000 Tachycardia, unspecified: Secondary | ICD-10-CM | POA: Diagnosis not present

## 2017-06-12 DIAGNOSIS — G4762 Sleep related leg cramps: Secondary | ICD-10-CM | POA: Diagnosis not present

## 2017-08-06 ENCOUNTER — Encounter: Payer: Self-pay | Admitting: Cardiology

## 2017-08-06 ENCOUNTER — Ambulatory Visit: Payer: BLUE CROSS/BLUE SHIELD | Admitting: Cardiology

## 2017-08-06 VITALS — BP 150/92 | HR 105 | Ht 62.0 in | Wt 269.2 lb

## 2017-08-06 DIAGNOSIS — R000 Tachycardia, unspecified: Secondary | ICD-10-CM | POA: Insufficient documentation

## 2017-08-06 DIAGNOSIS — E8881 Metabolic syndrome: Secondary | ICD-10-CM | POA: Diagnosis not present

## 2017-08-06 DIAGNOSIS — R011 Cardiac murmur, unspecified: Secondary | ICD-10-CM

## 2017-08-06 DIAGNOSIS — R030 Elevated blood-pressure reading, without diagnosis of hypertension: Secondary | ICD-10-CM | POA: Diagnosis not present

## 2017-08-06 DIAGNOSIS — I10 Essential (primary) hypertension: Secondary | ICD-10-CM | POA: Insufficient documentation

## 2017-08-06 NOTE — Patient Instructions (Signed)
MEDICATION INSTRUCTION  NO CHANGE     LABS TODAY CMP TSH   RECOMMEND KEEP UP WITH YOUR HEART RATE ON YOUR FITBIT AND KEEP A RECORDING.   TEST WILL SCHEDULE AT [address] Your physician has requested that you have an echocardiogram. Echocardiography is a painless test that uses sound waves to create images of your heart. It provides your doctor with information about the size and shape of your heart and how well your heart's chambers and valves are working. This procedure takes approximately one hour. There are no restrictions for this procedure.    Your physician recommends that you schedule a follow-up appointment in 1 MONTH WITH DR HARDING.

## 2017-08-06 NOTE — Progress Notes (Signed)
PCP: Tanna Furry, MD  Clinic Note: Chief Complaint  Patient presents with  . New Patient (Initial Visit)    "Abnormal EKG ", murmur    HPI:  Jessica Wang is a 42 y.o. female who is being seen today for the evaluation of "irregular EKG "and murmur heard on exam (sinus tachycardia and possible WPW) at the request of Zhou-Talbert, Ralene Bathe,*.  Notably, her father had a history of bicuspid aortic valve --> as a result had an an MI at age 33.  It is not apparent whether or not he had CAD related MI versus ACS related.  [name] was referred after being seen by her PCP back on February 19.  She is being followed for diabetes and blood pressure.  She was having some diarrhea from metformin.  On evaluation she is noted to be tachycardic and had a systolic murmur.  There is question of the EKG having a WPW pattern.  With her family history of bicuspid valve and presumably premature CAD with she is referred for cardiac evaluation.  Recent Hospitalizations: No recent admissions  Studies Personally Reviewed - (if available, images/films reviewed: From Epic Chart or Care Everywhere)  None  Interval History: She presents here today really with no major complaints.  She says that she has been under a lot of stress lately and can feel her heart rate go up and down with this.  She notes that when her heart rate goes up she may feel a little bit dizzy and may have a little chest discomfort.  But this is usually associated with stress and anxiety and not with exertion.  She can note her heart rate going up more frequently when she drinks caffeine and she is therefore try to cut down but still drinks about 2-3 cups a day.  She is insistent that she drinks at least 3 if not 4 16-20 ounce bottles of water a day though.  As far as other cardiac symptoms besides feeling her heart rate going fast, she denies any irregular heartbeats/palpitations.  No syncope or near syncope, just the  dizziness that she feels.  The only time she feels chest tightness is when she is feeling me rapid heartbeat spells.  She denies any exertional dyspnea or chest discomfort.  She is able to do pretty much whatever activity she would like to without having too much symptoms.  No chest pain or shortness of breath with rest or exertion. No PND, orthopnea or edema. No TIA/amaurosis fugax symptoms. No melena, hematochezia, hematuria, or epstaxis. No claudication.  ROS: A comprehensive was performed. Review of Systems  Constitutional: Negative for chills, fever and malaise/fatigue.  HENT: Negative for congestion and nosebleeds.   Respiratory: Positive for shortness of breath (Only minor fast heart rate happens). Negative for cough.   Gastrointestinal: Positive for diarrhea (Related to metformin) and heartburn. Negative for blood in stool.  Genitourinary: Negative for frequency.  Musculoskeletal: Negative for joint pain and myalgias.  Neurological: Positive for dizziness (With fast heartbeats). Negative for focal weakness.  Psychiatric/Behavioral: The patient is nervous/anxious (When she feels a heart fast heart rate spells she thinks is because she is stressed and anxious.).   All other systems reviewed and are negative.  PAD Screen 08/06/2017  Previous PAD dx? No  Previous surgical procedure? No  Pain with walking? No  Feet/toe relief with dangling? No  Painful, non-healing ulcers? No  Extremities discolored? No    I have reviewed and (if needed) personally updated the  patient's problem list, medications, allergies, past medical and surgical history, social and family history.   Past Medical History:  Diagnosis Date  . Hyperlipidemia associated with type 2 diabetes mellitus (HCC)   . Morbid obesity with BMI of 40.0-44.9, adult (HCC)   . Prediabetes   . Skin lesion 04/2013   left neck; is open and draining    Past Surgical History:  Procedure Laterality Date  . LESION REMOVAL Left  05/26/2013   Procedure: MINOR EXICISION OF Neck Skin LESION;  Surgeon: Serena Colonel, MD;  Location:  SUR[facility]ERY CENTER;  Service: ENT;  Laterality: Left;  . NO PAST SURGERIES      Current Meds  Medication Sig  . atorvastatin (LIPITOR) 40 MG tablet Take 1 tablet by mouth daily.  Marland Kitchen glipiZIDE (GLUCOTROL) 5 MG tablet Take 2 tablets by mouth daily.  . metFORMIN (GLUCOPHAGE) 1000 MG tablet Take 1 tablet by mouth 2 (two) times daily.    Allergies  Allergen Reactions  . Amoxicillin Hives    Social History   Tobacco Use  . Smoking status: Never Smoker  . Smokeless tobacco: Never Used  Substance Use Topics  . Alcohol use: Yes    Comment: occasionally  . Drug use: No   Social History   Social History Narrative   She is divorced but currently lives with her boyfriend/partner for 4 years.  No children.   1-2 alcoholic drinks (wine per week).   She walks her dogs 45 minutes a day 7 days a week and tries to do some other exercise but not routine.    family history includes Arthritis in her unknown relative; COPD in her maternal grandfather; Cancer in her unknown relative; Heart attack in her father and paternal grandfather; Heart disease in her unknown relative; Valvular heart disease in her father.  Wt Readings from Last 3 Encounters:  08/06/17 269 lb 3.2 oz (122.1 kg)  05/19/13 215 lb (97.5 kg)  02/13/13 220 lb (99.8 kg)    PHYSICAL EXAM BP (!) 150/92 (BP Location: Right Arm)   Pulse (!) 105   Ht 5\' 2"  (1.575 m)   Wt 269 lb 3.2 oz (122.1 kg)   BMI 49.24 kg/m  Physical Exam  Constitutional: She is oriented to person, place, and time. She appears well-developed and well-nourished. No distress.  Morbidly obese.  Well-groomed  HENT:  Head: Normocephalic and atraumatic.  Eyes: Pupils are equal, round, and reactive to light. Conjunctivae and EOM are normal.  Neck: Normal range of motion. Neck supple. No hepatojugular reflux and no JVD present. Carotid bruit is not present. No  tracheal deviation present. No thyromegaly present.  Cardiovascular: Regular rhythm and normal pulses.  No extrasystoles are present. Tachycardia present. PMI is not displaced (Unable to palpate). Exam reveals no gallop, no friction rub and no decreased pulses.  Murmur heard.  Harsh crescendo-decrescendo early systolic murmur is present with a grade of 1/6 at the upper right sternal border. Pulmonary/Chest: Effort normal and breath sounds normal. No respiratory distress. She has no rales.  Abdominal: Soft. Bowel sounds are normal. She exhibits no distension. There is no tenderness. There is no rebound.  Unable to palpate HSM due to body habitus  Musculoskeletal: Normal range of motion. She exhibits no edema.  Neurological: She is alert and oriented to person, place, and time. No cranial nerve deficit.  Skin: Skin is warm and dry. No erythema.  Psychiatric: She has a normal mood and affect. Her behavior is normal. Judgment and thought content normal.  Somewhat anxious appearing.  Nursing note and vitals reviewed.   Adult ECG Report  Rate: 105;  Rhythm: sinus tachycardia and Otherwise normal axis, intervals and durations.;   Narrative Interpretation: Other than sinus tachycardia, normal   Other studies Reviewed: Additional studies/ records that were reviewed today include:  Recent Labs: No labs available.  Supposedly were just checked by PCP but I do not have them   ASSESSMENT / PLAN: Problem List Items Addressed This Visit    Systolic ejection murmur - Primary (Chronic)    Somewhat faint, distant and soft systolic ejection murmur.  With family history of bicuspid aortic valve, we will check a 2D echo.      Relevant Orders   EKG 12-Lead (Completed)   ECHOCARDIOGRAM COMPLETE   Sinus tachycardia (Chronic)    Check CMP and TSH.  Would also probably benefit from CBCResting heart rate 105 beats minute here today.  Was noted to be tachycardic with PCP as well.  We talked about whether she  wears a monitor or just monitors her heart rate.  She will monitor her heart rate with Fitbit. We will check a CMP as well as TSH today. I suspect she had a CBC checked by PCP in the past.  Would like to exclude anemia.  Recommend decreasing caffeine and ensuring adequate hydration.      Relevant Orders   EKG 12-Lead (Completed)   ECHOCARDIOGRAM COMPLETE   TSH (Completed)   Comprehensive metabolic panel (Completed)   Metabolic syndrome    Based on BMI and elevated blood pressure, she has 2 out of the 5 criteria.  The fact that she has listed 2 diabetes/diabetes would suggest hypoglycemia as well.  Therefore probably meets 3 out of 5 criteria.  Labs from October showed a glucose random of 243 which is above threshold.  Recommendations would be weight loss and dietary modification along with increased exercise.  Closely following lipids and blood pressure as well as glycemic control.  She is on metformin and glipizide already along with Lipitor with labs being monitored by PCP.      Elevated blood pressure reading in office without diagnosis of hypertension    Certainly has high blood pressure here today.  If we were to treat her tachycardia, I may want to use an AV nodal agent that would potentially lower her heart rate.      Relevant Orders   TSH (Completed)   Comprehensive metabolic panel (Completed)       I spent a total of 40 minutes with the patient and chart review. >  50% of the time was spent in direct patient consultation.   Current medicines are reviewed at length with the patient today.  (+/- concerns) not on any blood pressure medicines yet The following changes have been made:  We will see what follow-up blood pressures and heart rates look like.  Patient Instructions  MEDICATION INSTRUCTION  NO CHANGE     LABS TODAY CMP TSH   RECOMMEND KEEP UP WITH YOUR HEART RATE ON YOUR FITBIT AND KEEP A RECORDING.   TEST WILL SCHEDULE AT [address]  [address] Your physician has requested that you have an echocardiogram. Echocardiography is a painless test that uses sound waves to create images of your heart. It provides your doctor with information about the size and shape of your heart and how well your heart's chambers and valves are working. This procedure takes approximately one hour. There are no restrictions for this procedure.  Your physician recommends that you schedule a follow-up appointment in 1 MONTH WITH DR HARDING.        Studies Ordered:   Orders Placed This Encounter  Procedures  . TSH  . Comprehensive metabolic panel  . EKG 12-Lead  . ECHOCARDIOGRAM COMPLETE      Bryan Lemma, M.D., M.S. Interventional Cardiologist   Pager # [phone] Phone # [phone] [address] [place], [place] [address]   Thank you for choosing [facility]!!

## 2017-08-07 LAB — COMPREHENSIVE METABOLIC PANEL
ALBUMIN: 4.1 g/dL (ref 3.5–5.5)
ALK PHOS: 112 IU/L (ref 39–117)
ALT: 37 IU/L — ABNORMAL HIGH (ref 0–32)
AST: 23 IU/L (ref 0–40)
Albumin/Globulin Ratio: 1.3 (ref 1.2–2.2)
BUN / CREAT RATIO: 15 (ref 9–23)
BUN: 9 mg/dL (ref 6–24)
CHLORIDE: 103 mmol/L (ref 96–106)
CO2: 25 mmol/L (ref 20–29)
Calcium: 9.5 mg/dL (ref 8.7–10.2)
Creatinine, Ser: 0.6 mg/dL (ref 0.57–1.00)
GFR calc non Af Amer: 114 mL/min/{1.73_m2} (ref >59)
GFR, EST AFRICAN AMERICAN: 131 mL/min/{1.73_m2} (ref >59)
GLOBULIN, TOTAL: 3.1 g/dL (ref 1.5–4.5)
Glucose: 79 mg/dL (ref 65–99)
Potassium: 4.2 mmol/L (ref 3.5–5.2)
SODIUM: 141 mmol/L (ref 134–144)
TOTAL PROTEIN: 7.2 g/dL (ref 6.0–8.5)

## 2017-08-07 LAB — TSH: TSH: 2.86 u[IU]/mL (ref 0.450–4.500)

## 2017-08-08 ENCOUNTER — Encounter: Payer: Self-pay | Admitting: Cardiology

## 2017-08-08 DIAGNOSIS — E8881 Metabolic syndrome: Secondary | ICD-10-CM | POA: Insufficient documentation

## 2017-08-08 NOTE — Assessment & Plan Note (Signed)
Certainly has high blood pressure here today.  If we were to treat her tachycardia, I may want to use an AV nodal agent that would potentially lower her heart rate.

## 2017-08-08 NOTE — Assessment & Plan Note (Addendum)
Check CMP and TSH.  Would also probably benefit from CBCResting heart rate 105 beats minute here today.  Was noted to be tachycardic with PCP as well.  We talked about whether she wears a monitor or just monitors her heart rate.  She will monitor her heart rate with Fitbit. We will check a CMP as well as TSH today. I suspect she had a CBC checked by PCP in the past.  Would like to exclude anemia.  Recommend decreasing caffeine and ensuring adequate hydration.

## 2017-08-08 NOTE — Assessment & Plan Note (Signed)
Based on BMI and elevated blood pressure, she has 2 out of the 5 criteria.  The fact that she has listed 2 diabetes/diabetes would suggest hypoglycemia as well.  Therefore probably meets 3 out of 5 criteria.  Labs from October showed a glucose random of 243 which is above threshold.  Recommendations would be weight loss and dietary modification along with increased exercise.  Closely following lipids and blood pressure as well as glycemic control.  She is on metformin and glipizide already along with Lipitor with labs being monitored by PCP.

## 2017-08-08 NOTE — Assessment & Plan Note (Signed)
Somewhat faint, distant and soft systolic ejection murmur.  With family history of bicuspid aortic valve, we will check a 2D echo.

## 2017-08-13 ENCOUNTER — Ambulatory Visit (HOSPITAL_COMMUNITY): Payer: BLUE CROSS/BLUE SHIELD | Attending: Internal Medicine

## 2017-08-13 ENCOUNTER — Other Ambulatory Visit: Payer: Self-pay

## 2017-08-13 DIAGNOSIS — E119 Type 2 diabetes mellitus without complications: Secondary | ICD-10-CM | POA: Diagnosis not present

## 2017-08-13 DIAGNOSIS — I071 Rheumatic tricuspid insufficiency: Secondary | ICD-10-CM | POA: Diagnosis not present

## 2017-08-13 DIAGNOSIS — R011 Cardiac murmur, unspecified: Secondary | ICD-10-CM

## 2017-08-13 DIAGNOSIS — E669 Obesity, unspecified: Secondary | ICD-10-CM | POA: Diagnosis not present

## 2017-08-13 DIAGNOSIS — R000 Tachycardia, unspecified: Secondary | ICD-10-CM | POA: Insufficient documentation

## 2017-08-13 HISTORY — PX: TRANSTHORACIC ECHOCARDIOGRAM: SHX275

## 2017-08-15 ENCOUNTER — Telehealth: Payer: Self-pay | Admitting: *Deleted

## 2017-08-15 NOTE — Telephone Encounter (Signed)
Patient made aware of results and verbalized her understanding.  

## 2017-08-15 NOTE — Telephone Encounter (Signed)
-----   Message from Marykay Lexavid W Harding, MD sent at 08/13/2017  7:38 PM EDT ----- Essentially normal Echocardiogram. Normal pump function (actually high normal).  Not unexpected abnormal relaxation with mild LVH.  Otherwise essentially normal.  Normal valves.  Bryan Lemmaavid Harding, MD  pls fwd to PCP: Zhou-Talbert, Ralene BatheSerena S, MD

## 2017-08-15 NOTE — Telephone Encounter (Signed)
Follow up    Patient calling for echo results 

## 2017-08-15 NOTE — Telephone Encounter (Signed)
Left message to call back - in regards of echo results

## 2017-08-30 ENCOUNTER — Ambulatory Visit: Payer: BLUE CROSS/BLUE SHIELD | Admitting: Cardiology

## 2017-08-30 ENCOUNTER — Encounter: Payer: Self-pay | Admitting: Cardiology

## 2017-08-30 VITALS — BP 157/91 | HR 115 | Ht 62.0 in | Wt 273.2 lb

## 2017-08-30 DIAGNOSIS — I10 Essential (primary) hypertension: Secondary | ICD-10-CM

## 2017-08-30 DIAGNOSIS — R000 Tachycardia, unspecified: Secondary | ICD-10-CM

## 2017-08-30 DIAGNOSIS — R011 Cardiac murmur, unspecified: Secondary | ICD-10-CM

## 2017-08-30 MED ORDER — METOPROLOL TARTRATE 25 MG PO TABS: 25.0000 mg | ORAL_TABLET | Freq: Two times a day (BID) | ORAL | 3 refills | Status: AC

## 2017-08-30 NOTE — Patient Instructions (Addendum)
MEDICATION INSTRUCTIONS  START METOPROLOL TARTRATE 25 MG TAKE TWICE A DAY FOLLOW INSTRUCTION ON HOW TO TIRTARATE MEDICATION. ( Sent RX to both local and mail order)   Your physician recommends that you schedule a follow-up appointment in 2 MONTH WITH DR Kirtland Bouchard. LAWRENCE,DNP   Your physician wants you to follow-up in DEC 2019 WITH DR HARDING.You will receive a reminder letter in the mail two months in advance. If you don't receive a letter, please call our office to schedule the follow-up appointment.

## 2017-08-30 NOTE — Progress Notes (Signed)
PCP: Tanna Furry, MD  Clinic Note: Chief Complaint  Patient presents with  . Follow-up    Echo results, tachycardia (personal history of bicuspid aortic valve)    HPI:  Jessica Wang is a 42 y.o. female who is being seen today for 13-month follow-up evaluation of "irregular EKG "and murmur heard on exam (sinus tachycardia and possible WPW) at the request of Zhou-Talbert, Serena S,*.  Notably, her father had a history of bicuspid aortic valve --> as a result had an an MI at age 71.  It is not apparent whether or not he had CAD related MI versus ACS related.  Jessica Wang was seen on August 06, 2017 for initial evaluation.  I was not convinced that I actually heard a murmur on exam (it was likely related to tachycardia). -->  In addition to concerns about a possible bicuspid aortic valve, she noted rapid heartbeat episodes, but also indicated that she drank quite a bit of coffee.  We evaluated her with an echocardiogram.  Recent Hospitalizations: No recent admissions  Studies Personally Reviewed - (if available, images/films reviewed: From Epic Chart or Care Everywhere)  2D echo 08/13/2017: LVEF 65-70%, mild LVH, normal wall motion, grade 1 DD, trivial MR, normal LA size, mild TR, RVSP 28 mmHg, normal IVC.   Interval History: She presents here today noting that she has cut her coffee down to maybe 1 cup a day and not doing any fatigue or caffeinated sodas.  She still feels her heart rate is up, and is following up with her Fitbit indicating that her heart rates have been anywhere from 90-115 bpm. She only had a little bit dizzy when she first stands up in the morning, but usually otherwise feels fine.  No irregularity to her heartbeats, just going fast. Otherwise relatively asymptomatic regarding standpoint: Cardiovascular review of symptoms: no chest pain or dyspnea on exertion positive for - rapid heart rate negative for - edema, irregular heartbeat, orthopnea,  paroxysmal nocturnal dyspnea, shortness of breath or Syncope/near syncope, TIA/amaurosis fugax.  No claudication.  ROS: A comprehensive was performed. Review of Systems  Constitutional: Negative for chills, fever and malaise/fatigue.  HENT: Negative for congestion and nosebleeds.   Respiratory: Positive for shortness of breath (, Or if she exerts herself too much if her heart rate goes very fast). Negative for cough.   Gastrointestinal: Positive for diarrhea (Related to metformin) and heartburn. Negative for blood in stool.  Genitourinary: Negative for hematuria.  Musculoskeletal: Negative for joint pain and myalgias.  Neurological: Positive for dizziness (With fast heartbeats). Negative for focal weakness.  Psychiatric/Behavioral: The patient is nervous/anxious (When she feels a heart fast heart rate spells she thinks is because she is stressed and anxious.).   All other systems reviewed and are negative.  PAD Screen 08/06/2017  Previous PAD dx? No  Previous surgical procedure? No  Pain with walking? No  Feet/toe relief with dangling? No  Painful, non-healing ulcers? No  Extremities discolored? No    I have reviewed and (if needed) personally updated the patient's problem list, medications, allergies, past medical and surgical history, social and family history.   Past Medical History:  Diagnosis Date  . Hyperlipidemia associated with type 2 diabetes mellitus (HCC)   . Morbid obesity with BMI of 40.0-44.9, adult (HCC)   . Prediabetes   . Skin lesion 04/2013   left neck; is open and draining    Past Surgical History:  Procedure Laterality Date  . LESION REMOVAL Left 05/26/2013  Procedure: MINOR EXICISION OF Neck Skin LESION;  Surgeon: Serena Colonel, MD;  Location: [facility];  Service: ENT;  Laterality: Left;  . NO PAST SURGERIES    . TRANSTHORACIC ECHOCARDIOGRAM  08/13/2017   LVEF 65-70%, mild LVH, normal wall motion, grade 1 DD, trivial MR, normal LA size, mild  TR, RVSP 28 mmHg, normal IVC.     Current Meds  Medication Sig  . atorvastatin (LIPITOR) 40 MG tablet Take 1 tablet by mouth daily.  Marland Kitchen glipiZIDE (GLUCOTROL) 5 MG tablet Take 5 mg by mouth daily before breakfast.   . metFORMIN (GLUCOPHAGE) 1000 MG tablet Take 1 tablet by mouth 2 (two) times daily.    Allergies  Allergen Reactions  . Amoxicillin Hives    Social History   Tobacco Use  . Smoking status: Never Smoker  . Smokeless tobacco: Never Used  Substance Use Topics  . Alcohol use: Yes    Comment: occasionally  . Drug use: No   Social History   Social History Narrative   She is divorced but currently lives with her boyfriend/partner for 4 years.  No children.   1-2 alcoholic drinks (wine per week).   She walks her dogs 45 minutes a day 7 days a week and tries to do some other exercise but not routine.    family history includes Arthritis in her unknown relative; COPD in her maternal grandfather; Cancer in her unknown relative; Heart attack in her father and paternal grandfather; Heart disease in her unknown relative; Valvular heart disease in her father.  Wt Readings from Last 3 Encounters:  08/30/17 273 lb 3.2 oz (123.9 kg)  08/06/17 269 lb 3.2 oz (122.1 kg)  05/19/13 215 lb (97.5 kg)    PHYSICAL EXAM BP (!) 157/91   Pulse (!) 115   Ht  (1.575 m)   Wt 273 lb 3.2 oz (123.9 kg)   BMI 49.97 kg/m  Physical Exam  Constitutional: She is oriented to person, place, and time. She appears well-developed and well-nourished. No distress.  Morbidly obese.  Well-groomed  HENT:  Head: Normocephalic and atraumatic.  Neck: Normal range of motion. Neck supple. No hepatojugular reflux and no JVD present. Carotid bruit is not present.  Cardiovascular: Regular rhythm and normal pulses.  No extrasystoles are present. Tachycardia present. PMI is not displaced (Unable to palpate). Exam reveals no gallop and no friction rub.  Murmur heard.  Harsh crescendo-decrescendo early  systolic murmur is present with a grade of 1/6 at the upper right sternal border. Pulmonary/Chest: Effort normal and breath sounds normal. No respiratory distress. She has no rales.  Abdominal: Soft. Bowel sounds are normal. She exhibits no distension. There is no tenderness. There is no rebound.  Unable to palpate HSM due to body habitus  Musculoskeletal: Normal range of motion. She exhibits no edema.  Neurological: She is alert and oriented to person, place, and time.  Psychiatric: She has a normal mood and affect. Her behavior is normal. Judgment and thought content normal.  Very pleasant and relaxed today.  Nursing note and vitals reviewed.   Adult ECG Report  Rate: 105;  Rhythm: sinus tachycardia and Otherwise normal axis, intervals and durations.;   Narrative Interpretation: Other than sinus tachycardia, normal   Other studies Reviewed: Additional studies/ records that were reviewed today include:  Recent Labs: No labs available.  Supposedly were just checked by PCP but I do not have them Lab Results  Component Value Date   CREATININE 0.60 08/06/2017  BUN 9 08/06/2017   NA 141 08/06/2017   K 4.2 08/06/2017   CL 103 08/06/2017   CO2 25 08/06/2017   Lab Results  Component Value Date   HGB 13.6 02/09/2017   HCT 40.0 02/09/2017   Lab Results  Component Value Date   TSH 2.860 08/06/2017  MRI so we did close to be reassured her heart rate beating really fast   ASSESSMENT / PLAN: Problem List Items Addressed This Visit    Systolic ejection murmur (Chronic)    No evidence of bicuspid aortic valve on echocardiogram (albeit poor imaging).  No evidence of stenosis.  I suspect that she may have a cardiac flow murmur, but nothing significant from a valve standpoint on echo.      Sinus tachycardia - Primary (Chronic)    Chemistry panel TSH looks fine.  I suspect she may very well have appropriate sinus tachycardia. Starting beta-blocker, avoid excess caffeine and maintain  adequate hydration. Overall relatively asymptomatic.      Essential hypertension (Chronic)    Blood pressure remains elevated today. With resting tachycardia, will start with beta-blocker (metoprolol tartrate 20 mg twice daily)      Relevant Medications   metoprolol tartrate (LOPRESSOR) 25 MG tablet   metoprolol tartrate (LOPRESSOR) 25 MG tablet      Current medicines are reviewed at length with the patient today.  (+/- concerns) not on any blood pressure medicines yet The following changes have been made:  We will see what follow-up blood pressures and heart rates look like.  Patient Instructions  MEDICATION INSTRUCTIONS  START METOPROLOL TARTRATE 25 MG TAKE TWICE A DAY FOLLOW INSTRUCTION ON HOW TO TIRTARATE MEDICATION. ( Sent RX to both local and mail order)   Your physician recommends that you schedule a follow-up appointment in 2 MONTH WITH DR Kirtland Bouchard. LAWRENCE,DNP   Your physician wants you to follow-up in DEC 2019 WITH DR Jenavive Lamboy.You will receive a reminder letter in the mail two months in advance. If you don't receive a letter, please call our office to schedule the follow-up appointment.     Studies Ordered:   No orders of the defined types were placed in this encounter.     Bryan Lemma, M.D., M.S. Interventional Cardiologist   Pager # [phone] Phone # [phone] [address] [place], [place] [address]   Thank you for choosing [facility] at [facility]!!

## 2017-09-02 ENCOUNTER — Encounter: Payer: Self-pay | Admitting: Cardiology

## 2017-09-02 NOTE — Assessment & Plan Note (Signed)
Blood pressure remains elevated today. With resting tachycardia, will start with beta-blocker (metoprolol tartrate 20 mg twice daily)

## 2017-09-02 NOTE — Assessment & Plan Note (Signed)
Chemistry panel TSH looks fine.  I suspect she may very well have appropriate sinus tachycardia. Starting beta-blocker, avoid excess caffeine and maintain adequate hydration. Overall relatively asymptomatic.

## 2017-09-02 NOTE — Assessment & Plan Note (Addendum)
No evidence of bicuspid aortic valve on echocardiogram (albeit poor imaging).  No evidence of stenosis.  I suspect that she may have a cardiac flow murmur, but nothing significant from a valve standpoint on echo.

## 2017-09-11 DIAGNOSIS — E119 Type 2 diabetes mellitus without complications: Secondary | ICD-10-CM | POA: Diagnosis not present

## 2017-09-11 DIAGNOSIS — R030 Elevated blood-pressure reading, without diagnosis of hypertension: Secondary | ICD-10-CM | POA: Diagnosis not present

## 2017-09-11 DIAGNOSIS — G4762 Sleep related leg cramps: Secondary | ICD-10-CM | POA: Diagnosis not present

## 2017-09-11 DIAGNOSIS — E782 Mixed hyperlipidemia: Secondary | ICD-10-CM | POA: Diagnosis not present

## 2017-10-29 NOTE — Progress Notes (Deleted)
Cardiology Office Note   Date:  10/29/2017   ID:  Jessica Wang, DOB 12/07/1975, MRN [id number]  PCP:  Tanna FurryZhou-Talbert, Serena S, MD  Cardiologist:  Dr. Herbie BaltimoreHarding   No chief complaint on file.    History of Present Illness: Jessica Wang is a 42 y.o. female who presents for ongoing assessment and management of sinus tachycardia, recent echocardiogram revealed LVEF of 65%-70%, with mild LVH, grade I diastolic dysfunction, trivial MR and mild TR.  No evidence of bicuspid AoV that has been suspected. TSH was normal She was started on metoprolol 25 mg daily.     Past Medical History:  Diagnosis Date  . Hyperlipidemia associated with type 2 diabetes mellitus (HCC)   . Morbid obesity with BMI of 40.0-44.9, adult (HCC)   . Prediabetes   . Skin lesion 04/2013   left neck; is open and draining    Past Surgical History:  Procedure Laterality Date  . LESION REMOVAL Left 05/26/2013   Procedure: MINOR EXICISION OF Neck Skin LESION;  Surgeon: Serena ColonelJefry Rosen, MD;  Location: [facility];  Service: ENT;  Laterality: Left;  . NO PAST SURGERIES    . TRANSTHORACIC ECHOCARDIOGRAM  08/13/2017   LVEF 65-70%, mild LVH, normal wall motion, grade 1 DD, trivial MR, normal LA size, mild TR, RVSP 28 mmHg, normal IVC.      Current Outpatient Medications  Medication Sig Dispense Refill  . atorvastatin (LIPITOR) 40 MG tablet Take 1 tablet by mouth daily.    Marland Kitchen. glipiZIDE (GLUCOTROL) 5 MG tablet Take 5 mg by mouth daily before breakfast.     . metFORMIN (GLUCOPHAGE) 1000 MG tablet Take 1 tablet by mouth 2 (two) times daily.    . metoprolol tartrate (LOPRESSOR) 25 MG tablet Take 1 tablet (25 mg total) by mouth 2 (two) times daily. 180 tablet 3  . metoprolol tartrate (LOPRESSOR) 25 MG tablet Take 1 tablet (25 mg total) by mouth 2 (two) times daily. 180 tablet 3   No current facility-administered medications for this visit.     Allergies:   Amoxicillin    Social History:  The patient  reports  that she has never smoked. She has never used smokeless tobacco. She reports that she drinks alcohol. She reports that she does not use drugs.   Family History:  The patient's family history includes Arthritis in her unknown relative; COPD in her maternal grandfather; Cancer in her unknown relative; Heart attack in her father and paternal grandfather; Heart disease in her unknown relative; Valvular heart disease in her father.    ROS: All other systems are reviewed and negative. Unless otherwise mentioned in H&P    PHYSICAL EXAM: VS:  There were no vitals taken for this visit. , BMI There is no height or weight on file to calculate BMI. GEN: Well nourished, well developed, in no acute distress  HEENT: normal  Neck: no JVD, carotid bruits, or masses Cardiac: ***RRR; no murmurs, rubs, or gallops,no edema  Respiratory:  clear to auscultation bilaterally, normal work of breathing GI: soft, nontender, nondistended, + BS MS: no deformity or atrophy  Skin: warm and dry, no rash Neuro:  Strength and sensation are intact Psych: euthymic mood, full affect   EKG:  EKG {ACTION; IS/IS [id number]} ordered today. The ekg ordered today demonstrates ***   Recent Labs: 02/09/2017: Hemoglobin 13.6 08/06/2017: ALT 37; BUN 9; Creatinine, Ser 0.60; Potassium 4.2; Sodium 141; TSH 2.860    Lipid Panel No results found for: CHOL, TRIG, HDL, CHOLHDL, VLDL,  LDLCALC, LDLDIRECT    Wt Readings from Last 3 Encounters:  08/30/17 273 lb 3.2 oz (123.9 kg)  08/06/17 269 lb 3.2 oz (122.1 kg)  05/19/13 215 lb (97.5 kg)      Other studies Reviewed: Additional studies/ records that were reviewed today include: ***. Review of the above records demonstrates: ***   ASSESSMENT AND PLAN:  1.  ***   Current medicines are reviewed at length with the patient today.    Labs/ tests ordered today include: *** Bettey Mare. Liborio Nixon, ANP, AACC   10/29/2017 8:56 AM    [facility] [address] [place], [place] [address] Phone: [phone]; Fax: [phone]

## 2017-10-30 ENCOUNTER — Ambulatory Visit: Payer: BLUE CROSS/BLUE SHIELD | Admitting: Adult Health

## 2017-11-08 ENCOUNTER — Ambulatory Visit: Payer: Self-pay | Admitting: "Endocrinology

## 2017-11-12 ENCOUNTER — Ambulatory Visit: Payer: BLUE CROSS/BLUE SHIELD | Admitting: Nutrition

## 2018-01-02 DIAGNOSIS — R030 Elevated blood-pressure reading, without diagnosis of hypertension: Secondary | ICD-10-CM | POA: Diagnosis not present

## 2018-01-02 DIAGNOSIS — E782 Mixed hyperlipidemia: Secondary | ICD-10-CM | POA: Diagnosis not present

## 2018-01-02 DIAGNOSIS — E119 Type 2 diabetes mellitus without complications: Secondary | ICD-10-CM | POA: Diagnosis not present

## 2018-01-02 DIAGNOSIS — G4762 Sleep related leg cramps: Secondary | ICD-10-CM | POA: Diagnosis not present

## 2018-01-02 DIAGNOSIS — F419 Anxiety disorder, unspecified: Secondary | ICD-10-CM | POA: Diagnosis not present

## 2018-01-02 DIAGNOSIS — R740 Nonspecific elevation of levels of transaminase and lactic acid dehydrogenase [LDH]: Secondary | ICD-10-CM | POA: Diagnosis not present

## 2018-05-14 DIAGNOSIS — G4762 Sleep related leg cramps: Secondary | ICD-10-CM | POA: Diagnosis not present

## 2018-05-14 DIAGNOSIS — F419 Anxiety disorder, unspecified: Secondary | ICD-10-CM | POA: Diagnosis not present

## 2018-05-14 DIAGNOSIS — R030 Elevated blood-pressure reading, without diagnosis of hypertension: Secondary | ICD-10-CM | POA: Diagnosis not present

## 2018-05-14 DIAGNOSIS — E119 Type 2 diabetes mellitus without complications: Secondary | ICD-10-CM | POA: Diagnosis not present

## 2018-08-21 DIAGNOSIS — F419 Anxiety disorder, unspecified: Secondary | ICD-10-CM | POA: Diagnosis not present

## 2018-08-21 DIAGNOSIS — E119 Type 2 diabetes mellitus without complications: Secondary | ICD-10-CM | POA: Diagnosis not present

## 2018-08-21 DIAGNOSIS — R030 Elevated blood-pressure reading, without diagnosis of hypertension: Secondary | ICD-10-CM | POA: Diagnosis not present

## 2018-08-21 DIAGNOSIS — G4762 Sleep related leg cramps: Secondary | ICD-10-CM | POA: Diagnosis not present

## 2019-03-12 DIAGNOSIS — E119 Type 2 diabetes mellitus without complications: Secondary | ICD-10-CM | POA: Diagnosis not present

## 2019-03-12 DIAGNOSIS — G4762 Sleep related leg cramps: Secondary | ICD-10-CM | POA: Diagnosis not present

## 2019-03-12 DIAGNOSIS — F419 Anxiety disorder, unspecified: Secondary | ICD-10-CM | POA: Diagnosis not present

## 2019-03-12 DIAGNOSIS — R030 Elevated blood-pressure reading, without diagnosis of hypertension: Secondary | ICD-10-CM | POA: Diagnosis not present

## 2019-03-13 ENCOUNTER — Other Ambulatory Visit (HOSPITAL_COMMUNITY): Payer: Self-pay | Admitting: Family Medicine

## 2019-03-13 DIAGNOSIS — Z1231 Encounter for screening mammogram for malignant neoplasm of breast: Secondary | ICD-10-CM

## 2019-04-11 DIAGNOSIS — R030 Elevated blood-pressure reading, without diagnosis of hypertension: Secondary | ICD-10-CM | POA: Diagnosis not present

## 2019-04-11 DIAGNOSIS — Z1159 Encounter for screening for other viral diseases: Secondary | ICD-10-CM | POA: Diagnosis not present

## 2019-04-11 DIAGNOSIS — E119 Type 2 diabetes mellitus without complications: Secondary | ICD-10-CM | POA: Diagnosis not present

## 2019-04-11 DIAGNOSIS — E782 Mixed hyperlipidemia: Secondary | ICD-10-CM | POA: Diagnosis not present

## 2019-06-01 ENCOUNTER — Other Ambulatory Visit: Payer: Self-pay

## 2019-06-01 ENCOUNTER — Ambulatory Visit
Admission: EM | Admit: 2019-06-01 | Discharge: 2019-06-01 | Disposition: A | Discharge status: Home / Self Care | Payer: Managed Care, Other (non HMO) | Attending: Family Medicine | Admitting: Family Medicine

## 2019-06-01 DIAGNOSIS — L03119 Cellulitis of unspecified part of limb: Secondary | ICD-10-CM | POA: Diagnosis present

## 2019-06-01 DIAGNOSIS — L02419 Cutaneous abscess of limb, unspecified: Secondary | ICD-10-CM | POA: Insufficient documentation

## 2019-06-01 MED ORDER — DOXYCYCLINE HYCLATE 100 MG PO CAPS: 100.0000 mg | ORAL_CAPSULE | Freq: Two times a day (BID) | ORAL | 0 refills | Status: AC

## 2019-06-01 NOTE — ED Provider Notes (Signed)
[facility]    CSN: [id number] Arrival date & time: 06/01/19  0093      History   Chief Complaint Chief Complaint  Patient presents with  . Abscess    HPI Jessica Wang is a 44 y.o. female.   Reports that she has had a boil to her R upper inner thigh x 3 days. Reports that she shaved her legs and thinks that she may have an ingrown hair. Reports tenderness, heat, redness. Reports that she has tried to "pop" it herself at home without success. Denies fever, chills, loss of sensation, decreased mobility.  The history is provided by the patient.  Abscess Associated symptoms: no fever and no vomiting     Past Medical History:  Diagnosis Date  . Hyperlipidemia associated with type 2 diabetes mellitus ([place])   . Morbid obesity with BMI of 40.0-44.9, adult ([place])   . Prediabetes   . Skin lesion 04/2013   left neck; is open and draining    Patient Active Problem List   Diagnosis Date Noted  . Metabolic syndrome 81/82/9937  . Sinus tachycardia 08/06/2017  . Systolic ejection murmur 16/96/7893  . Essential hypertension 08/06/2017  . Knee MCL sprain 01/16/2013    Past Surgical History:  Procedure Laterality Date  . LESION REMOVAL Left 05/26/2013   Procedure: MINOR EXICISION OF Neck Skin LESION;  Surgeon: Izora Gala, MD;  Location: [facility];  Service: ENT;  Laterality: Left;  . NO PAST SURGERIES    . TRANSTHORACIC ECHOCARDIOGRAM  08/13/2017   LVEF 65-70%, mild LVH, normal wall motion, grade 1 DD, trivial MR, normal LA size, mild TR, RVSP 28 mmHg, normal IVC.     OB History   No obstetric history on file.      Home Medications    Prior to Admission medications   Medication Sig Start Date End Date Taking? Authorizing Provider  atorvastatin (LIPITOR) 40 MG tablet Take 1 tablet by mouth daily. 06/15/17   [provider]  doxycycline (VIBRAMYCIN) 100 MG capsule Take 1 capsule (100 mg total) by mouth 2 (two) times daily for 10 days.  06/01/19 06/11/19  Faustino Congress, NP  escitalopram (LEXAPRO) 10 MG tablet Take 10 mg by mouth daily. 03/03/19   [provider]  glipiZIDE (GLUCOTROL) 5 MG tablet Take 5 mg by mouth daily before breakfast.  06/13/17   [provider]  metFORMIN (GLUCOPHAGE) 1000 MG tablet Take 1 tablet by mouth 2 (two) times daily. 06/14/17   [provider]  metoprolol tartrate (LOPRESSOR) 25 MG tablet Take 1 tablet (25 mg total) by mouth 2 (two) times daily. 08/30/17 11/28/17  Leonie Man, MD  metoprolol tartrate (LOPRESSOR) 25 MG tablet Take 1 tablet (25 mg total) by mouth 2 (two) times daily. 08/30/17 11/28/17  Leonie Man, MD    Family History Family History  Problem Relation Age of Onset  . Heart disease Other   . Cancer Other   . Arthritis Other   . Heart attack Father        Presumably related to bicuspid aortic valve.  . Valvular heart disease Father        Bicuspid aortic valve, diagnosed at age 67 with MI  . COPD Maternal Grandfather   . Heart attack Paternal Grandfather     Social History Social History   Tobacco Use  . Smoking status: Never Smoker  . Smokeless tobacco: Never Used  Substance Use Topics  . Alcohol use: Yes  Comment: occasionally  . Drug use: No     Allergies   Amoxicillin   Review of Systems Review of Systems  Constitutional: Negative for chills and fever.  HENT: Negative for ear pain and sore throat.   Eyes: Negative for pain and visual disturbance.  Respiratory: Negative for cough and shortness of breath.   Cardiovascular: Negative for chest pain and palpitations.  Gastrointestinal: Negative for abdominal pain and vomiting.  Genitourinary: Negative for dysuria and hematuria.  Musculoskeletal: Negative for arthralgias and back pain.  Skin: Negative for color change and rash.  Neurological: Negative for seizures and syncope.  All other systems reviewed and are negative.    Physical Exam Triage Vital Signs ED Triage  Vitals [06/01/19 0835]  Enc Vitals Group     BP 118/71     Pulse Rate (!) 117     Resp 18     Temp 98.3 F (36.8 C)     Temp src      SpO2 97 %     Weight      Height      Head Circumference      Peak Flow      Pain Score 8     Pain Loc      Pain Edu?      Excl. in GC?    No data found.  Updated Vital Signs BP 118/71   Pulse (!) 117   Temp 98.3 F (36.8 C)   Resp 18   LMP 05/06/2019   SpO2 97%   Visual Acuity Right Eye Distance:   Left Eye Distance:   Bilateral Distance:    Right Eye Near:   Left Eye Near:    Bilateral Near:     Physical Exam Vitals and nursing note reviewed.  Constitutional:      General: She is not in acute distress.    Appearance: She is well-developed. She is obese.  HENT:     Head: Normocephalic and atraumatic.  Eyes:     Conjunctiva/sclera: Conjunctivae normal.  Cardiovascular:     Rate and Rhythm: Normal rate and regular rhythm.     Heart sounds: No murmur.  Pulmonary:     Effort: Pulmonary effort is normal. No respiratory distress.     Breath sounds: Normal breath sounds.  Abdominal:     Palpations: Abdomen is soft.     Tenderness: There is no abdominal tenderness.  Musculoskeletal:     Cervical back: Neck supple.     Right lower leg: Tenderness present.       Legs:  Skin:    General: Skin is warm and dry.  Neurological:     General: No focal deficit present.     Mental Status: She is alert and oriented to person, place, and time.  Psychiatric:        Mood and Affect: Mood normal.        Behavior: Behavior normal.      UC Treatments / Results  Labs (all labs ordered are listed, but only abnormal results are displayed) Labs Reviewed  AEROBIC/ANAEROBIC CULTURE (SURGICAL/DEEP WOUND)    EKG   Radiology No results found.  Procedures Incision and Drainage  Date/Time: 06/01/2019 9:32 AM Performed by: Moshe Cipro, NP Authorized by: Moshe Cipro, NP   Consent:    Consent obtained:  Verbal    Consent given by:  Patient   Risks discussed:  Infection, incomplete drainage, pain and bleeding   Alternatives discussed:  Alternative treatment and referral Universal  protocol:    Patient identity confirmed:  Verbally with patient Location:    Type:  Abscess   Size:  5 x 5cm   Location:  Lower extremity   Lower extremity location:  Leg   Leg location:  R upper leg Pre-procedure details:    Skin preparation:  Antiseptic wash and Betadine Anesthesia (see MAR for exact dosages):    Anesthesia method:  Local infiltration   Local anesthetic:  Lidocaine 2% w/o epi Procedure type:    Complexity:  Simple Procedure details:    Incision types:  Single straight   Incision depth:  Dermal   Scalpel blade:  11   Wound management:  Irrigated with saline   Drainage:  Purulent   Drainage amount:  Moderate   Wound treatment:  Wound left open   Packing materials:  None Post-procedure details:    Patient tolerance of procedure:  Tolerated well, no immediate complications   (including critical care time)  Medications Ordered in UC Medications - No data to display  Initial Impression / Assessment and Plan / UC Course  I have reviewed the triage vital signs and the nursing notes.  Pertinent labs & imaging results that were available during my care of the patient were reviewed by me and considered in my medical decision making (see chart for details).     Abscess to R upper inner thigh. I&D in office. Unable to obtain all material from abscess. Unable to deloculate abscess in office. Instructed on infection precaution, follow up with primary care provider for referral to general surgery. Prescribed Doxycycline 100mg  BID x 10 days. Wound culture obtained and sent to lab. Will call with results.  Final Clinical Impressions(s) / UC Diagnoses   Final diagnoses:  Cellulitis and abscess of leg, except foot     Discharge Instructions     You have an abscess on your leg. We have opened it in  the office to relieve some of the pressure. I have sent in antibiotics for you. Take all of the medication that was prescribed.   Follow up with your primary care provider for referral to general surgery.  Report to the Emergency Room with sudden increase in pain, high fever, severe diarrhea or other concerning symptoms.     ED Prescriptions    Medication Sig Dispense Auth. Provider   doxycycline (VIBRAMYCIN) 100 MG capsule Take 1 capsule (100 mg total) by mouth 2 (two) times daily for 10 days. 20 capsule , NP     I have reviewed the PDMP during this encounter.   Moshe Cipro, NP 06/01/19 [phone]

## 2019-06-01 NOTE — ED Triage Notes (Signed)
Pt presents with c/o boil on inner aspect of right upper thigh  That developed a couple days ago

## 2019-06-01 NOTE — Discharge Instructions (Signed)
You have an abscess on your leg. We have opened it in the office to relieve some of the pressure. I have sent in antibiotics for you. Take all of the medication that was prescribed.   Follow up with your primary care provider for referral to general surgery.  Report to the Emergency Room with sudden increase in pain, high fever, severe diarrhea or other concerning symptoms.

## 2019-06-04 LAB — AEROBIC CULTURE W GRAM STAIN (SUPERFICIAL SPECIMEN)

## 2019-06-04 LAB — AEROBIC CULTURE? (SUPERFICIAL SPECIMEN)

## 2019-07-07 ENCOUNTER — Encounter: Payer: Self-pay | Admitting: Cardiology

## 2019-09-03 ENCOUNTER — Inpatient Hospital Stay (HOSPITAL_COMMUNITY): Admission: RE | Admit: 2019-09-03 | Payer: Managed Care, Other (non HMO) | Source: Ambulatory Visit

## 2019-09-05 ENCOUNTER — Ambulatory Visit (HOSPITAL_COMMUNITY): Payer: Managed Care, Other (non HMO)

## 2021-03-08 ENCOUNTER — Other Ambulatory Visit (HOSPITAL_COMMUNITY): Payer: Self-pay | Admitting: Family Medicine

## 2021-03-08 DIAGNOSIS — Z1231 Encounter for screening mammogram for malignant neoplasm of breast: Secondary | ICD-10-CM

## 2022-07-25 ENCOUNTER — Ambulatory Visit
Admission: RE | Admit: 2022-07-25 | Discharge: 2022-07-25 | Disposition: A | Discharge status: Home / Self Care | Payer: Managed Care, Other (non HMO) | Source: Ambulatory Visit | Attending: Family Medicine | Admitting: Family Medicine

## 2022-07-25 VITALS — BP 175/100 | HR 117 | Temp 98.7°F | Resp 22

## 2022-07-25 DIAGNOSIS — N760 Acute vaginitis: Secondary | ICD-10-CM | POA: Diagnosis present

## 2022-07-25 DIAGNOSIS — K648 Other hemorrhoids: Secondary | ICD-10-CM | POA: Diagnosis present

## 2022-07-25 DIAGNOSIS — N390 Urinary tract infection, site not specified: Secondary | ICD-10-CM | POA: Diagnosis present

## 2022-07-25 HISTORY — DX: Papillomavirus as the cause of diseases classified elsewhere: B97.7

## 2022-07-25 LAB — POCT URINALYSIS DIP (MANUAL ENTRY)
Bilirubin, UA: NEGATIVE
Glucose, UA: >=1000 mg/dL — AB
Nitrite, UA: POSITIVE — AB
Protein Ur, POC: NEGATIVE mg/dL
Spec Grav, UA: 1.015 (ref 1.010–1.025)
Urobilinogen, UA: 1 E.U./dL
pH, UA: 6 (ref 5.0–8.0)

## 2022-07-25 MED ORDER — NITROFURANTOIN MONOHYD MACRO 100 MG PO CAPS: 100.0000 mg | ORAL_CAPSULE | Freq: Two times a day (BID) | ORAL | 0 refills | Status: AC

## 2022-07-25 MED ORDER — FLUCONAZOLE 150 MG PO TABS: 150.0000 mg | ORAL_TABLET | ORAL | 0 refills | Status: AC

## 2022-07-25 MED ORDER — HYDROCORTISONE ACETATE 25 MG RE SUPP: 25.0000 mg | Freq: Two times a day (BID) | RECTAL | 0 refills | Status: AC

## 2022-07-25 NOTE — ED Triage Notes (Signed)
Pt reports she is urinating a lot and it is painful "feels like she is peeing glass", vaginal itching, low abdominal pain, and she has had bright red blood in her stool x 3 days . Pt is using hemmroid cream and hemcalm tabs.  "Weird film-like "discharge  and vag itching x 3 weeks.

## 2022-07-25 NOTE — ED Provider Notes (Signed)
[facility]    CSN: [id number] Arrival date & time: 07/25/22  1821      History   Chief Complaint Chief Complaint  Patient presents with   Urinary Tract Infection    HPI Jessica Wang is a 47 y.o. female.   Patient presenting today with several day history of vaginal discharge and itching, irritation with bowel movements and bright red blood in the stool from what she assumes is an internal hemorrhoid, and severe dysuria and urinary frequency.  She states has been dealing with intermittent yeast infections for quite some time.  Trying hemorrhoid cream, fluids with minimal relief.  Denies abdominal pain, fever, chills, concern for STDs or pregnancy.    Past Medical History:  Diagnosis Date   HPV in female    Hyperlipidemia associated with type 2 diabetes mellitus    Morbid obesity with BMI of 40.0-44.9, adult    Prediabetes    Skin lesion 04/2013   left neck; is open and draining    Patient Active Problem List   Diagnosis Date Noted   Metabolic syndrome [id number]   Sinus tachycardia [id number]   Systolic ejection murmur [id number]   Essential hypertension 08/06/2017   Knee MCL sprain 01/16/2013    Past Surgical History:  Procedure Laterality Date   LESION REMOVAL Left 05/26/2013   Procedure: MINOR EXICISION OF Neck Skin LESION;  Surgeon: Izora Gala, MD;  Location: [place];  Service: ENT;  Laterality: Left;   NO PAST SURGERIES     TRANSTHORACIC ECHOCARDIOGRAM  08/13/2017   LVEF 65-70%, mild LVH, normal wall motion, grade 1 DD, trivial MR, normal LA size, mild TR, RVSP 28 mmHg, normal IVC.     OB History   No obstetric history on file.      Home Medications    Prior to Admission medications   Medication Sig Start Date End Date Taking? Authorizing Provider  fluconazole (DIFLUCAN) 150 MG tablet Take 1 tablet (150 mg total) by mouth every other day. 07/25/22  Yes Volney American, PA-C  hydrocortisone (ANUSOL-HC) 25 MG  suppository Place 1 suppository (25 mg total) rectally 2 (two) times daily. 07/25/22  Yes Volney American, PA-C  nitrofurantoin, macrocrystal-monohydrate, (MACROBID) 100 MG capsule Take 1 capsule (100 mg total) by mouth 2 (two) times daily. 07/25/22  Yes Volney American, PA-C  atorvastatin (LIPITOR) 40 MG tablet Take 1 tablet by mouth daily. 06/15/17   [provider]  escitalopram (LEXAPRO) 10 MG tablet Take 10 mg by mouth daily. 03/03/19   [provider]  glipiZIDE (GLUCOTROL) 5 MG tablet Take 5 mg by mouth daily before breakfast.  06/13/17   [provider]  metFORMIN (GLUCOPHAGE) 1000 MG tablet Take 1 tablet by mouth 2 (two) times daily. 06/14/17   [provider]  metoprolol tartrate (LOPRESSOR) 25 MG tablet Take 1 tablet (25 mg total) by mouth 2 (two) times daily. 08/30/17 11/28/17  Leonie Man, MD  metoprolol tartrate (LOPRESSOR) 25 MG tablet Take 1 tablet (25 mg total) by mouth 2 (two) times daily. 08/30/17 11/28/17  Leonie Man, MD    Family History Family History  Problem Relation Age of Onset   Heart disease Other    Cancer Other    Arthritis Other    Heart attack Father        Presumably related to bicuspid aortic valve.   Valvular heart disease Father        Bicuspid aortic valve, diagnosed at age 53 with  MI   COPD Maternal Grandfather    Heart attack Paternal Grandfather     Social History Social History   Tobacco Use   Smoking status: Never   Smokeless tobacco: Never  Substance Use Topics   Alcohol use: Yes    Comment: occasionally   Drug use: No     Allergies   Amoxicillin   Review of Systems Review of Systems Per HPI  Physical Exam Triage Vital Signs ED Triage Vitals  Enc Vitals Group     BP 07/25/22 1851 (!) 175/100     Pulse Rate 07/25/22 1851 (!) 117     Resp 07/25/22 1851 (!) 22     Temp 07/25/22 1851 98.7 F (37.1 C)     Temp Source 07/25/22 1851 Oral     SpO2 07/25/22 1851 97 %     Weight --       Height --      Head Circumference --      Peak Flow --      Pain Score 07/25/22 1853 6     Pain Loc --      Pain Edu? --      Excl. in [place]? --    No data found.  Updated Vital Signs BP (!) 175/100 (BP Location: Right Arm)   Pulse (!) 117   Temp 98.7 F (37.1 C) (Oral)   Resp (!) 22   LMP 06/28/2022   SpO2 97%   Visual Acuity Right Eye Distance:   Left Eye Distance:   Bilateral Distance:    Right Eye Near:   Left Eye Near:    Bilateral Near:     Physical Exam Vitals and nursing note reviewed.  Constitutional:      Appearance: Normal appearance. She is not ill-appearing.  HENT:     Head: Atraumatic.     Mouth/Throat:     Mouth: Mucous membranes are moist.  Eyes:     Extraocular Movements: Extraocular movements intact.     Conjunctiva/sclera: Conjunctivae normal.  Cardiovascular:     Rate and Rhythm: Normal rate and regular rhythm.     Heart sounds: Normal heart sounds.  Pulmonary:     Effort: Pulmonary effort is normal.     Breath sounds: Normal breath sounds.  Abdominal:     General: Bowel sounds are normal. There is no distension.     Palpations: Abdomen is soft.     Tenderness: There is no abdominal tenderness. There is no right CVA tenderness, left CVA tenderness or guarding.  Genitourinary:    Comments: GU exam deferred with shared decision making. Musculoskeletal:        General: Normal range of motion.     Cervical back: Normal range of motion and neck supple.  Skin:    General: Skin is warm and dry.  Neurological:     Mental Status: She is alert and oriented to person, place, and time.  Psychiatric:        Mood and Affect: Mood normal.        Thought Content: Thought content normal.        Judgment: Judgment normal.      UC Treatments / Results  Labs (all labs ordered are listed, but only abnormal results are displayed) Labs Reviewed  POCT URINALYSIS DIP (MANUAL ENTRY) - Abnormal; Notable for the following components:      Result Value    Color, UA orange (*)    Glucose, UA >=1,000 (*)    Ketones, POC  UA small (15) (*)    Blood, UA trace-intact (*)    Nitrite, UA Positive (*)    Leukocytes, UA Trace (*)    All other components within normal limits  URINE CULTURE  CERVICOVAGINAL ANCILLARY ONLY    EKG   Radiology No results found.  Procedures Procedures (including critical care time)  Medications Ordered in UC Medications - No data to display  Initial Impression / Assessment and Plan / UC Course  I have reviewed the triage vital signs and the nursing notes.  Pertinent labs & imaging results that were available during my care of the patient were reviewed by me and considered in my medical decision making (see chart for details).     Hypertensive, mildly tachycardic in triage, otherwise vital signs reassuring.  Her exam is very reassuring with no concerning findings.  Urinalysis with evidence of a UTI, treat with Macrobid while awaiting urine culture for further evaluation.  Will also give Anusol suppositories, Diflucan for her other symptoms.  Return for worsening symptoms.  Final Clinical Impressions(s) / UC Diagnoses   Final diagnoses:  Acute lower UTI  Internal hemorrhoid  Acute vaginitis   Discharge Instructions   None    ED Prescriptions     Medication Sig Dispense Auth. Provider   hydrocortisone (ANUSOL-HC) 25 MG suppository Place 1 suppository (25 mg total) rectally 2 (two) times daily. 12 suppository Volney American, [place]   fluconazole (DIFLUCAN) 150 MG tablet Take 1 tablet (150 mg total) by mouth every other day. 3 tablet Volney American, PA-C   nitrofurantoin, macrocrystal-monohydrate, (MACROBID) 100 MG capsule Take 1 capsule (100 mg total) by mouth 2 (two) times daily. 10 capsule Volney American, [place]      PDMP not reviewed this encounter.   Volney American, [place] 07/25/22 1931

## 2022-07-26 LAB — CERVICOVAGINAL ANCILLARY ONLY
Bacterial Vaginitis (gardnerella): NEGATIVE
Candida Glabrata: NEGATIVE
Candida Vaginitis: POSITIVE — AB
Chlamydia: NEGATIVE
Comment: NEGATIVE
Comment: NEGATIVE
Comment: NEGATIVE
Comment: NEGATIVE
Comment: NEGATIVE
Comment: NORMAL
Neisseria Gonorrhea: NEGATIVE
Trichomonas: NEGATIVE

## 2022-07-26 LAB — URINE CULTURE: Culture: >=100000 — AB

## 2022-07-27 LAB — URINE CULTURE

## 2022-07-28 LAB — URINE CULTURE

## 2023-05-18 ENCOUNTER — Other Ambulatory Visit (HOSPITAL_COMMUNITY): Payer: Self-pay | Admitting: Family Medicine

## 2023-05-18 DIAGNOSIS — Z1231 Encounter for screening mammogram for malignant neoplasm of breast: Secondary | ICD-10-CM

## 2023-05-24 ENCOUNTER — Encounter (INDEPENDENT_AMBULATORY_CARE_PROVIDER_SITE_OTHER): Payer: Self-pay | Admitting: *Deleted

## 2023-11-21 ENCOUNTER — Encounter (INDEPENDENT_AMBULATORY_CARE_PROVIDER_SITE_OTHER): Payer: Self-pay | Admitting: *Deleted
